# Patient Record
Sex: Female | Born: 1960 | Race: White | Hispanic: No | Marital: Married | Smoking: Never smoker
Health system: Southern US, Community
[De-identification: ages and names within clinical notes are randomized; demographics above are authoritative.]

## PROBLEM LIST (undated history)

## (undated) DIAGNOSIS — D693 Immune thrombocytopenic purpura: Secondary | ICD-10-CM

## (undated) HISTORY — DX: Immune thrombocytopenic purpura: D69.3

---

## 1998-03-11 DIAGNOSIS — IMO0002 Reserved for concepts with insufficient information to code with codable children: Secondary | ICD-10-CM | POA: Insufficient documentation

## 2005-06-19 DIAGNOSIS — E7800 Pure hypercholesterolemia, unspecified: Secondary | ICD-10-CM | POA: Insufficient documentation

## 2005-07-05 ENCOUNTER — Ambulatory Visit: Payer: Self-pay | Admitting: Internal Medicine

## 2005-07-09 ENCOUNTER — Ambulatory Visit: Payer: Self-pay | Admitting: Internal Medicine

## 2005-08-09 ENCOUNTER — Ambulatory Visit: Payer: Self-pay | Admitting: Internal Medicine

## 2005-09-12 ENCOUNTER — Ambulatory Visit: Payer: Self-pay | Admitting: Internal Medicine

## 2005-10-09 ENCOUNTER — Ambulatory Visit: Payer: Self-pay | Admitting: Internal Medicine

## 2006-04-11 ENCOUNTER — Ambulatory Visit: Payer: Self-pay

## 2006-09-09 ENCOUNTER — Ambulatory Visit: Payer: Self-pay | Admitting: Internal Medicine

## 2007-04-11 IMAGING — US ABDOMEN ULTRASOUND
1 series · 17 of 25 positions shown · non-contrast
Comparison: none

REASON FOR EXAM: Thrombocytopenia eval for hepatosplenomegaly
COMMENTS:

[Series 1: abdomen ultrasound · 17 of 63 slices shown]
[im 1/63]
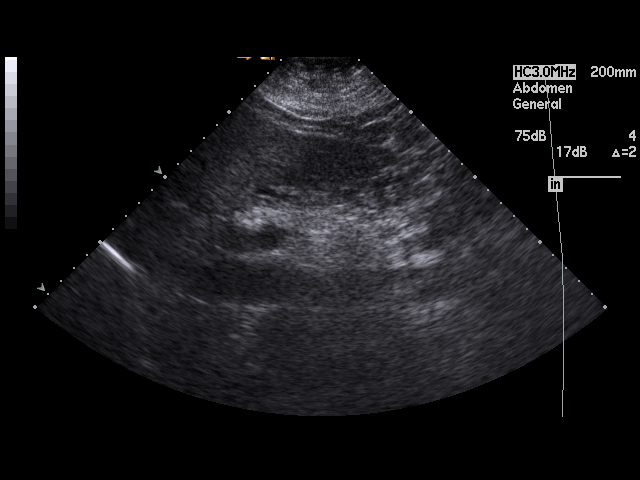
[im 6/63]
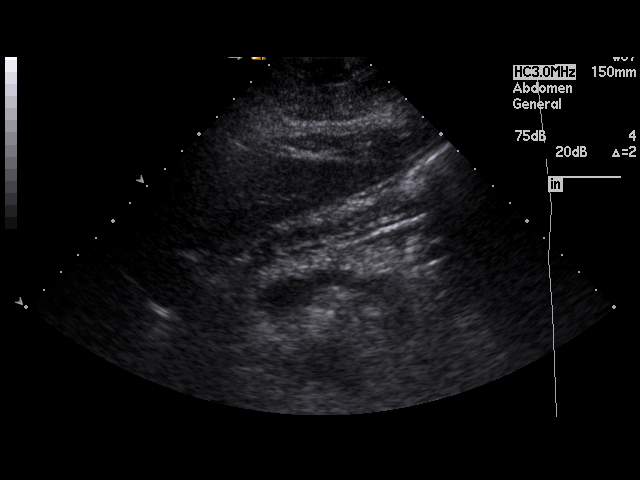
[im 8/63]
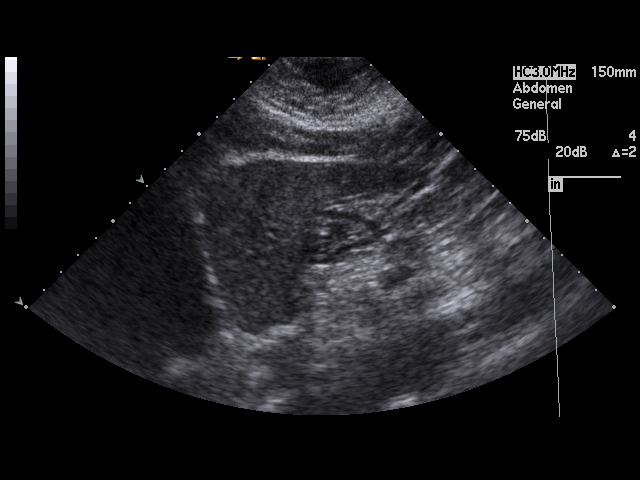
[im 13/63]
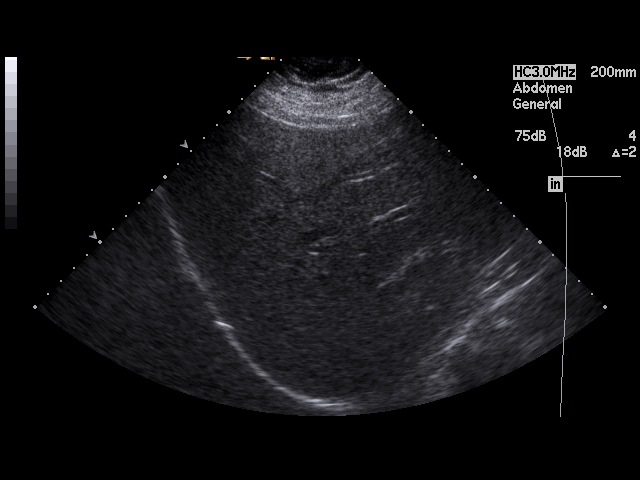
[im 16/63]
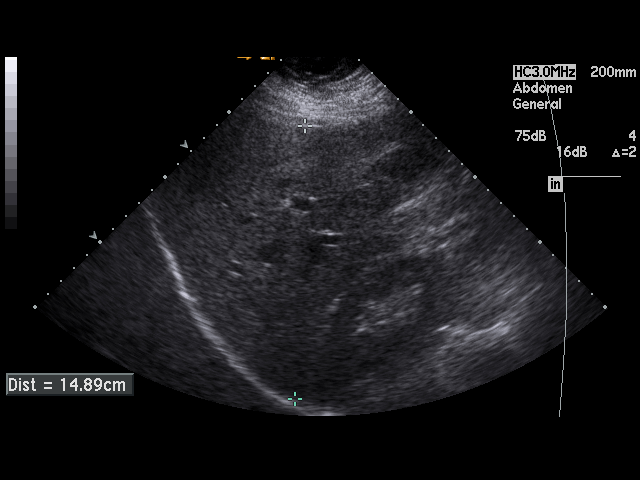
[im 21/63]
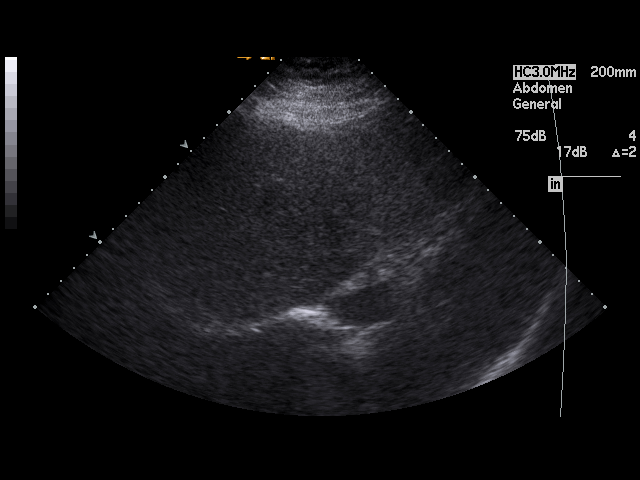
[im 24/63]
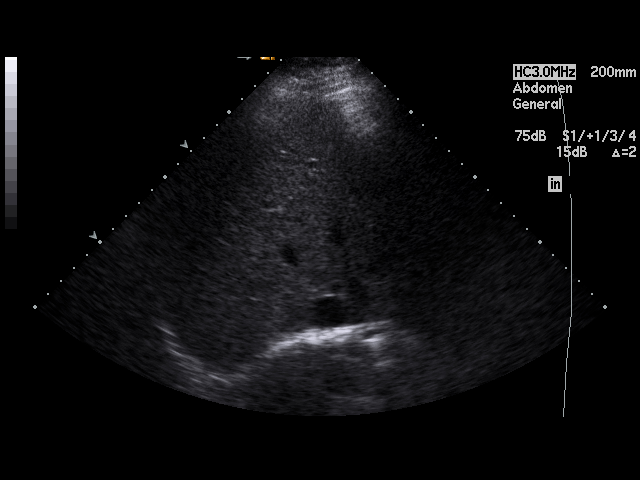
[im 29/63]
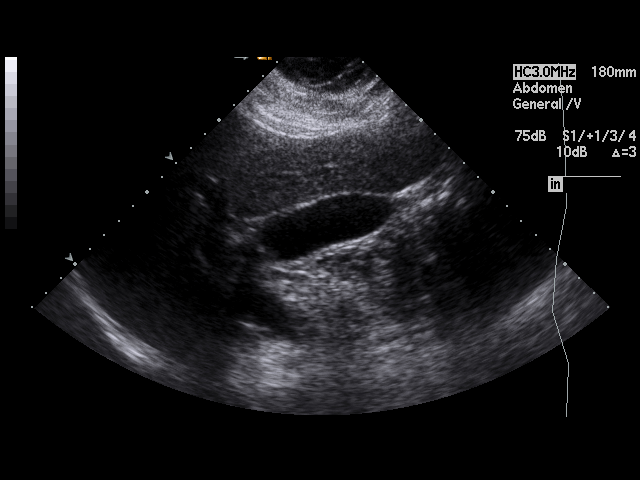
[im 32/63]
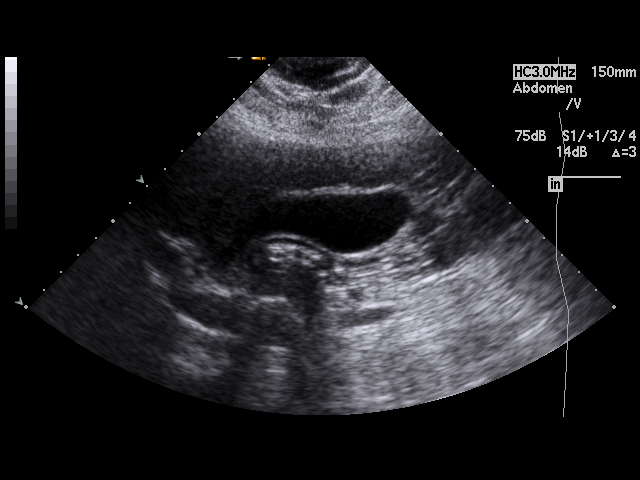
[im 34/63]
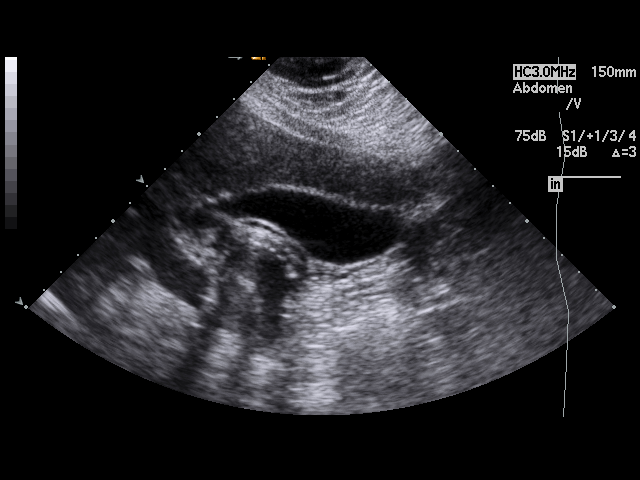
[im 39/63]
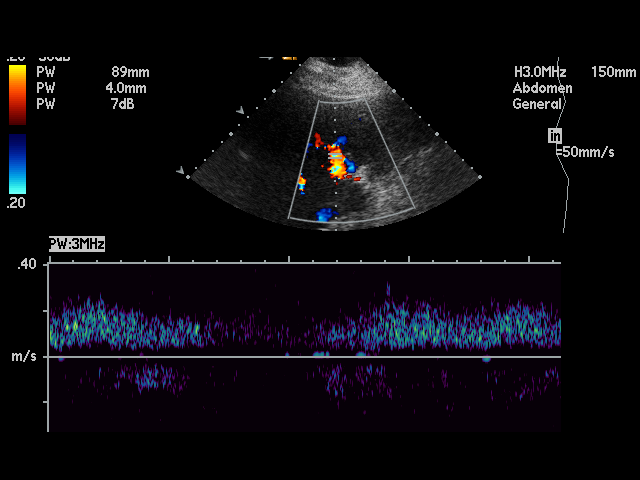
[im 42/63]
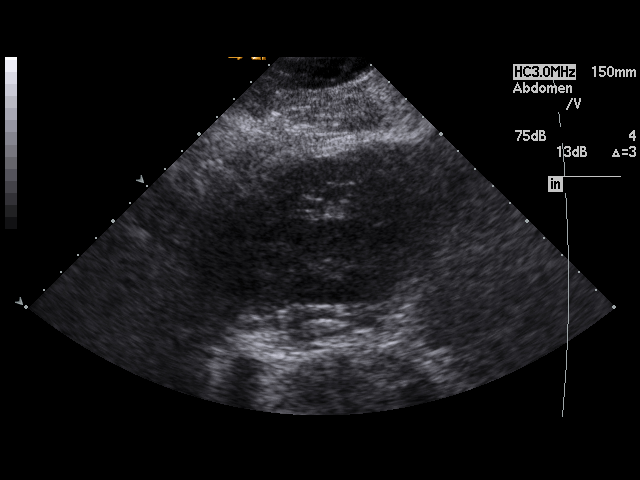
[im 47/63]
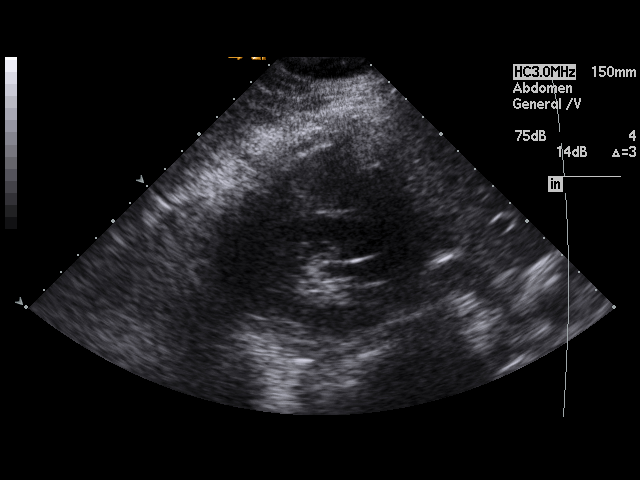
[im 50/63]
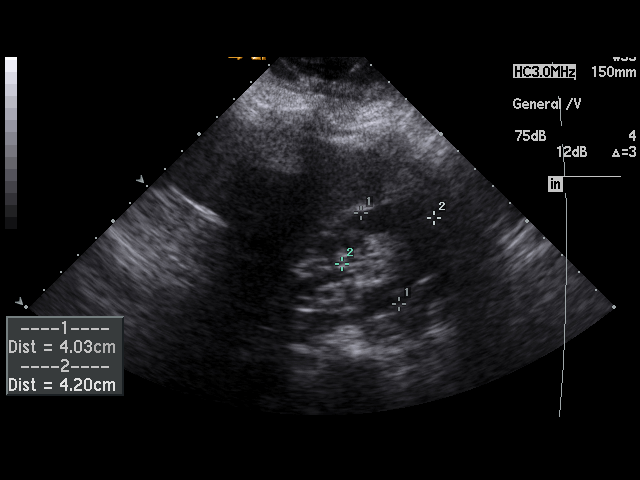
[im 55/63]
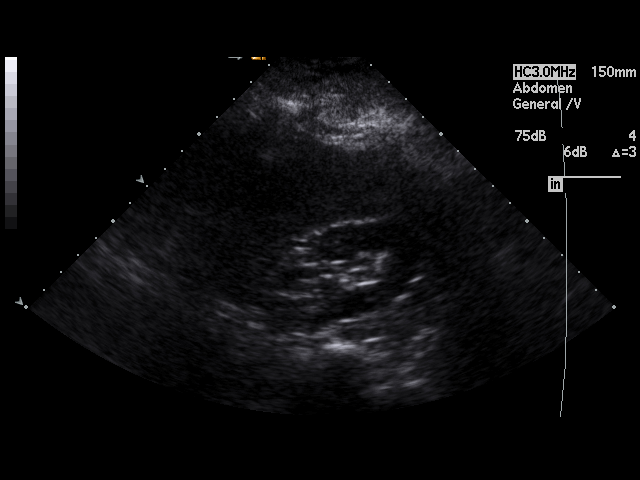
[im 57/63]
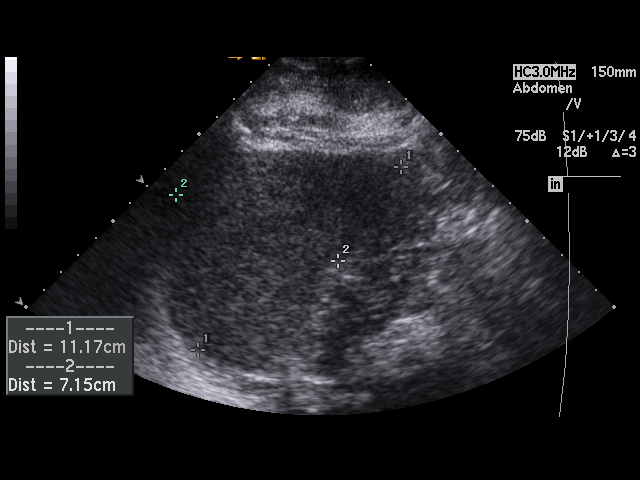
[im 63/63]
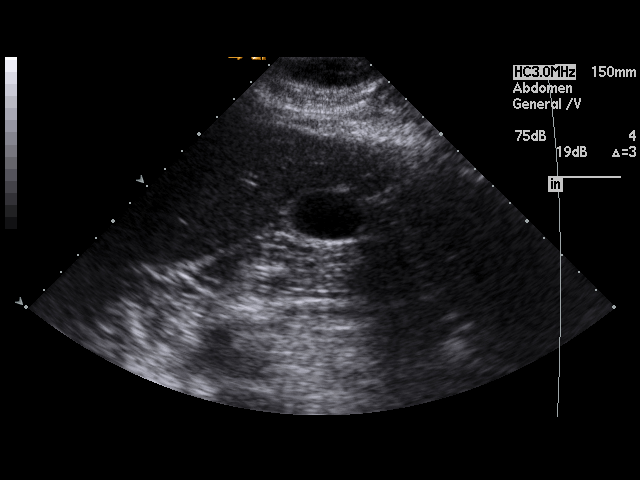

[17 of 25 positions shown; findings below may reference images not displayed]

PROCEDURE:     US  - US ABDOMEN GENERAL SURVEY  - July 08, 2005  [DATE]

RESULT:       The liver is normal in appearance.  The pancreas is visualized
and shows no significant abnormalities.  The spleen is enlarged. The spleen
measures approximately 16 cm x 7.15 cm.  No gallstones are seen.  There is
no thickening of the gallbladder wall.  The common bile duct measures 2.1 mm
in diameter which is within normal limits.  The kidneys show no
hydronephrosis. There is no ascites.
IMPRESSION: 1.     The spleen is enlarged.
2.     No gallstones are seen.
3.     The pancreas is normal in appearance.
4.     There is no ascites.

## 2007-04-25 IMAGING — CT CT ABD-PELV W/ CM
1 of 2 series · 15 of 32 positions shown, 19 images · non-contrast
Comparison: none

REASON FOR EXAM: Splenomegaly on US  thrombocytopenia  eval for lymphoma
COMMENTS:

[Series 2: abdomen · axial · 0.90mm/px · z∈[-223,+177]mm · 15 of 56 slices shown, 19 images]
[im 3/56  soft-tissue]
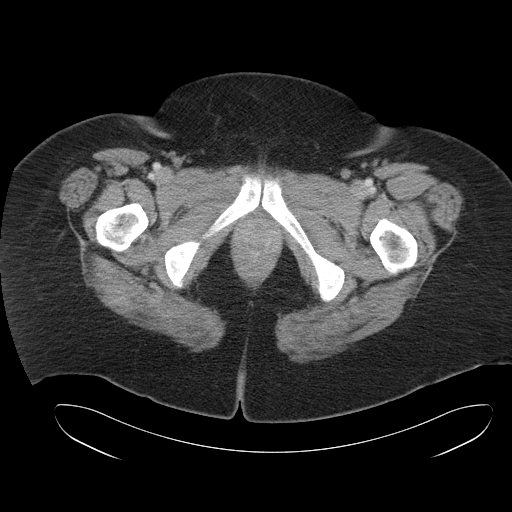
[im 3/56  bone]
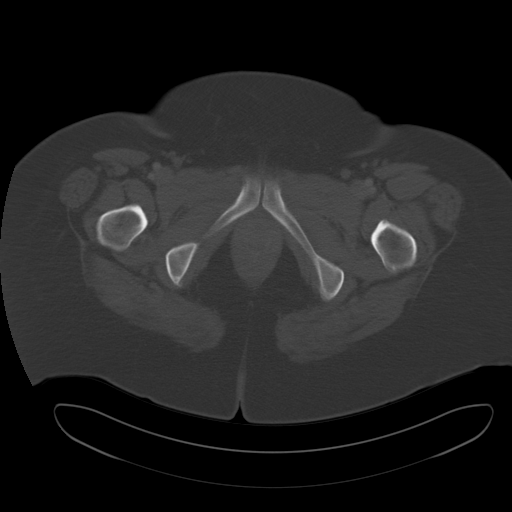
[im 8/56  soft-tissue]
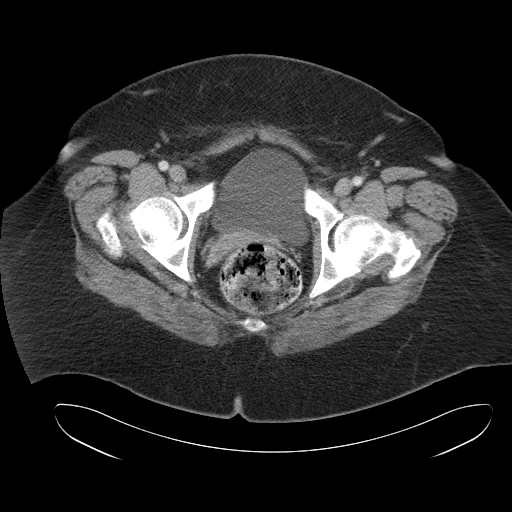
[im 12/56  soft-tissue]
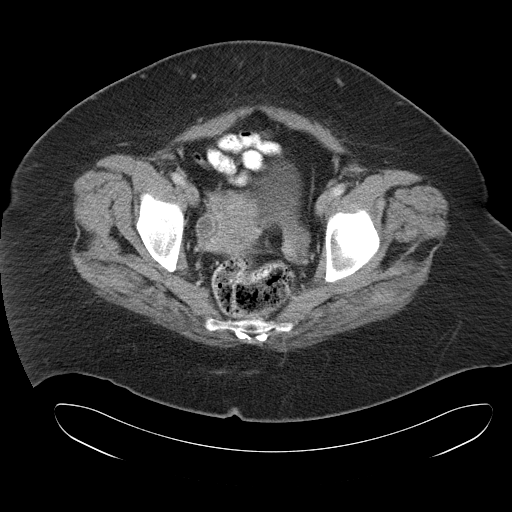
[im 15/56  soft-tissue]
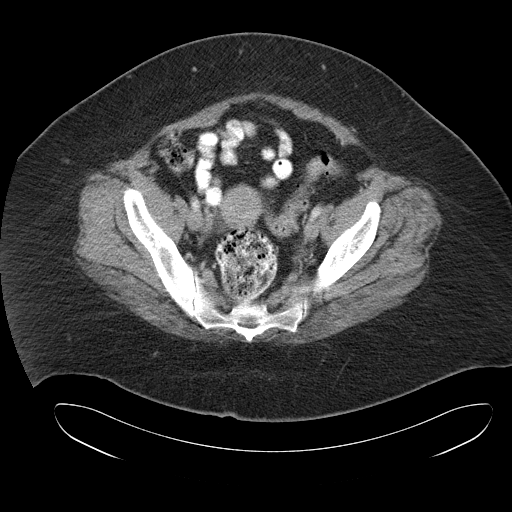
[im 20/56  soft-tissue]
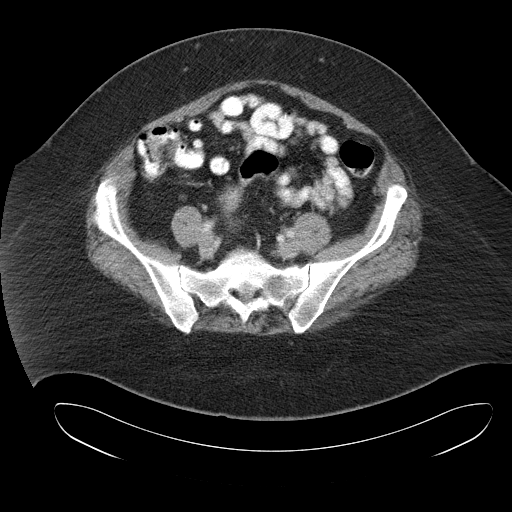
[im 24/56  soft-tissue]
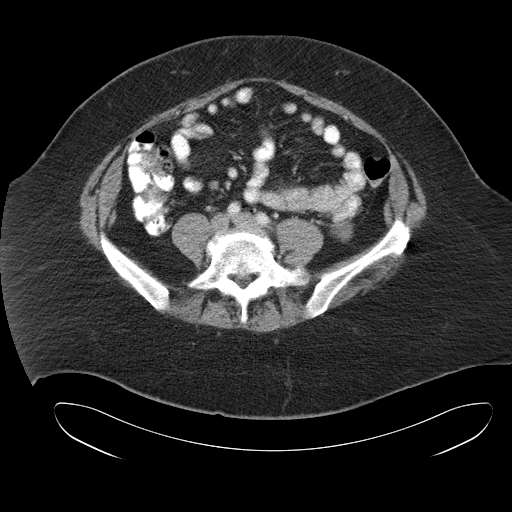
[im 29/56  soft-tissue]
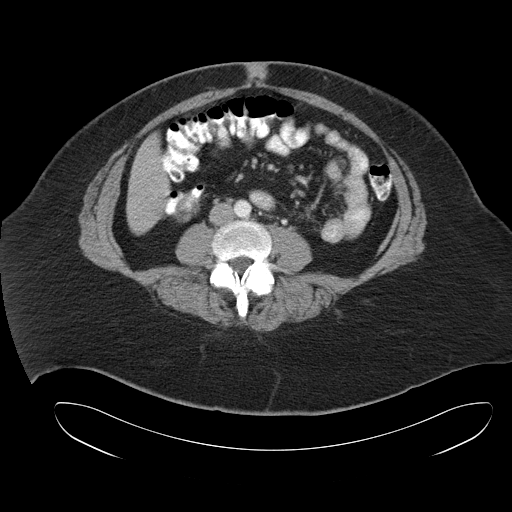
[im 32/56  soft-tissue]
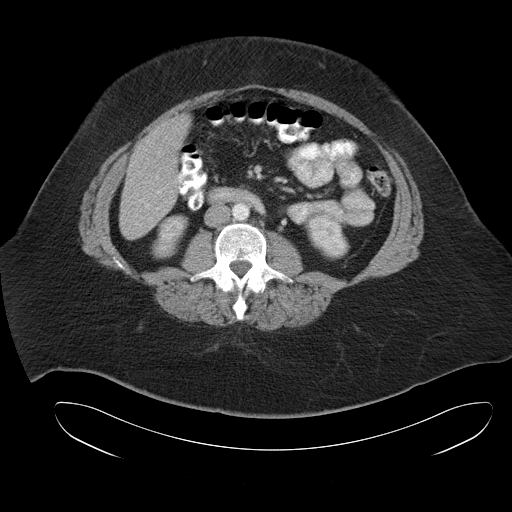
[im 36/56  soft-tissue]
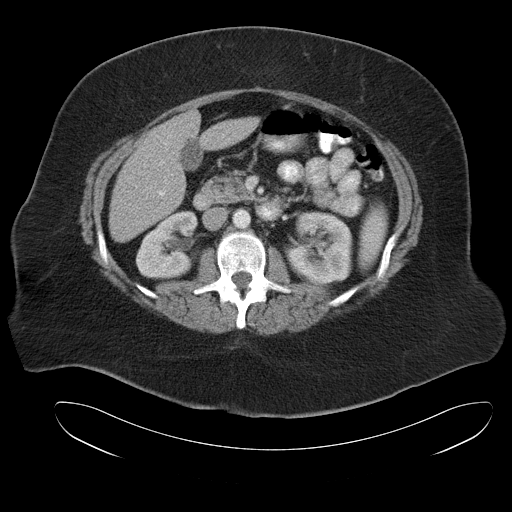
[im 36/56  bone]
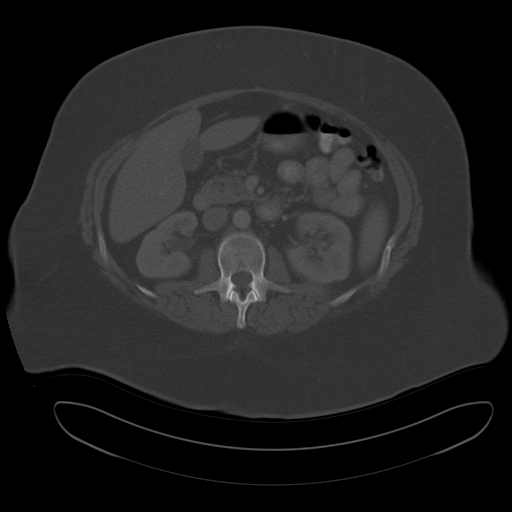
[im 41/56  soft-tissue]
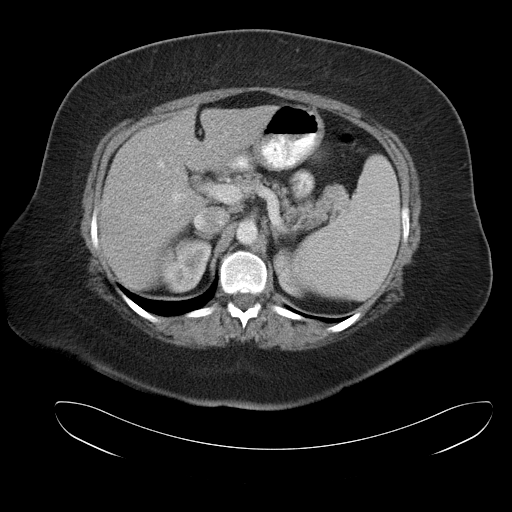
[im 44/56  soft-tissue]
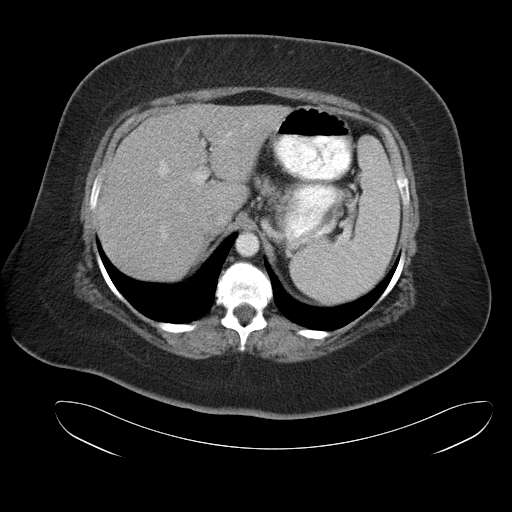
[im 46/56  lung]
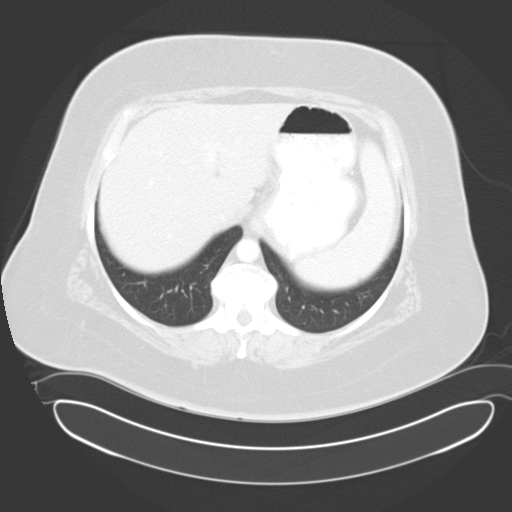
[im 48/56  soft-tissue]
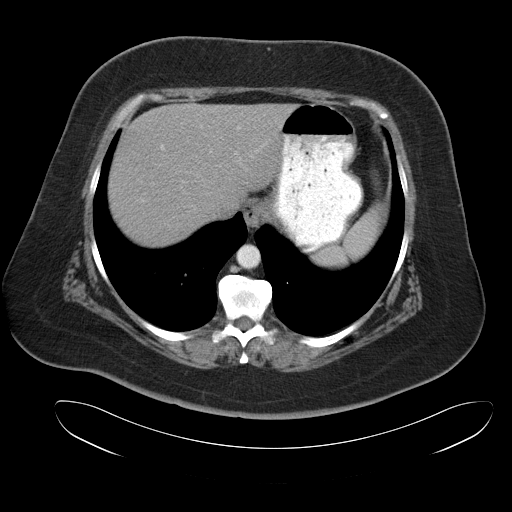
[im 48/56  lung]
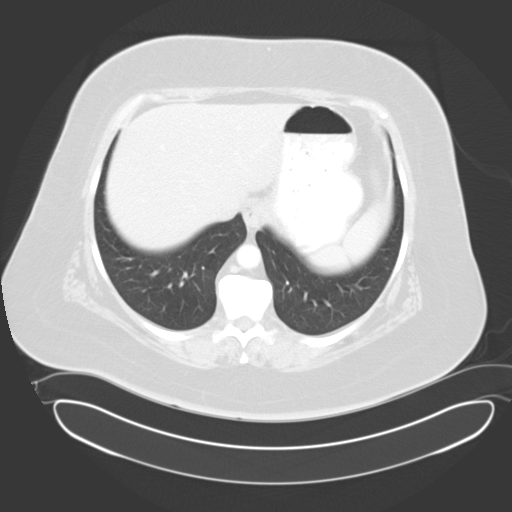
[im 51/56  lung]
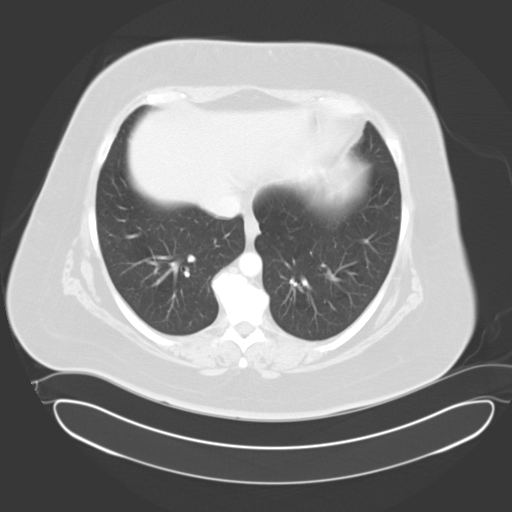
[im 53/56  soft-tissue]
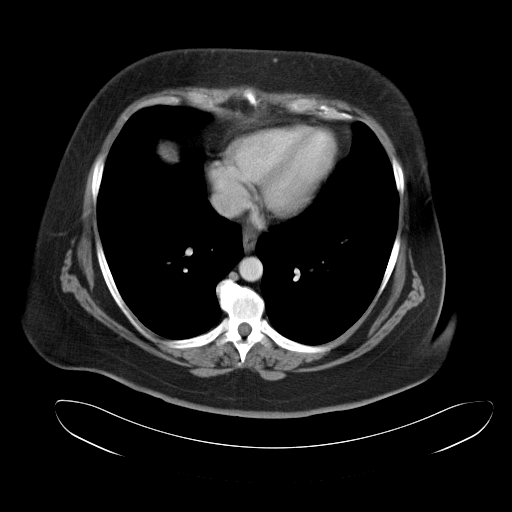
[im 53/56  lung]
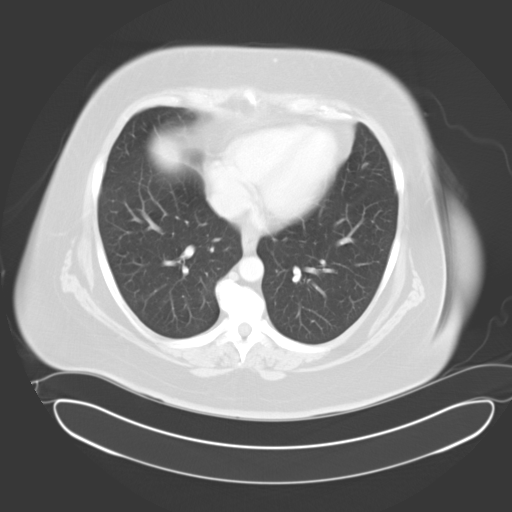

[15 of 32 positions shown; findings below may reference images not displayed]

PROCEDURE:     CT  - CT ABDOMEN / PELVIS  W  - July 22, 2005  [DATE]

RESULT:       The patient has known splenomegaly and has thrombocytopenia
and is being evaluated for possible intra-abdominal or pelvic lymphoma.
The patient received oral contrast as well as 100 ml of [id number].  The
spleen is top normal in size measuring 13 ml in length.  In AP dimension, it
measures approximately 15 ml. In transverse dimension, however, its maximal
dimension is approximately 9.0 cm.  The liver, stomach, pancreas,
gallbladder, adrenal glands, and kidneys are normal in appearance.  The
paraaortic and pericaval regions exhibit no lymphadenopathy.  I do not see
evidence of mesenteric lymphadenopathy.  No abnormal size lymph nodes in the
gastric bed or in the porta hepatis are seen either.  The partially
contrast-filled loops of small and large bowel exhibit no acute abnormality.

Within the pelvis, the urinary bladder is moderately distended and grossly
normal.  The uterus is situated slightly to the RIGHT of midline but appears
normal in density and contour.  There are likely physiologic cysts
associated with both ovaries.  There is no free fluid in the abdomen or
pelvis.  I see no pelvic nor inguinal lymphadenopathy.  The rectum is
moderately distended with stool.

The lung bases are clear.
IMPRESSION: 1.     I do not see evidence of lymphadenopathy.
2.     There is mild enlargement of the spleen which may be normal for the
patient in terms of relationship of the spleen size to body habitus.
3.     I do not see evidence of acute hepatobiliary disease nor acute renal
abnormality nor acute bowel abnormality.
4.     There are likely physiologic cysts associated with both ovaries.

## 2007-06-10 ENCOUNTER — Ambulatory Visit: Payer: Self-pay | Admitting: Internal Medicine

## 2007-07-10 ENCOUNTER — Ambulatory Visit: Payer: Self-pay | Admitting: Internal Medicine

## 2007-08-10 ENCOUNTER — Ambulatory Visit: Payer: Self-pay | Admitting: Internal Medicine

## 2007-09-09 ENCOUNTER — Ambulatory Visit: Payer: Self-pay | Admitting: Internal Medicine

## 2007-10-10 ENCOUNTER — Ambulatory Visit: Payer: Self-pay | Admitting: Internal Medicine

## 2007-11-17 ENCOUNTER — Ambulatory Visit: Payer: Self-pay | Admitting: Internal Medicine

## 2007-12-10 ENCOUNTER — Ambulatory Visit: Payer: Self-pay | Admitting: Internal Medicine

## 2008-01-10 ENCOUNTER — Ambulatory Visit: Payer: Self-pay | Admitting: Internal Medicine

## 2008-01-13 IMAGING — US US PELV - US TRANSVAGINAL
1 series · 17 of 25 positions shown · non-contrast
Comparison: none

REASON FOR EXAM: Irregular Menses
COMMENTS:

PROCEDURE:     US  - US PELVIS MASS EXAM  - [DATE]  [DATE] [DATE]  [DATE]
RESULT:
HISTORY: Irregular menses.
The size, shape and echotexture of the uterus is normal.  The uterine length
is 6.7 cm.   The RIGHT ovary measures 2.9 cm in maximum diameter and the
LEFT 2.6. Simple cysts are noted in both ovaries.  There is no
hydronephrosis.

[Series 1: us pelv - us transvaginal · 17 of 61 slices shown]
[im 1/61]
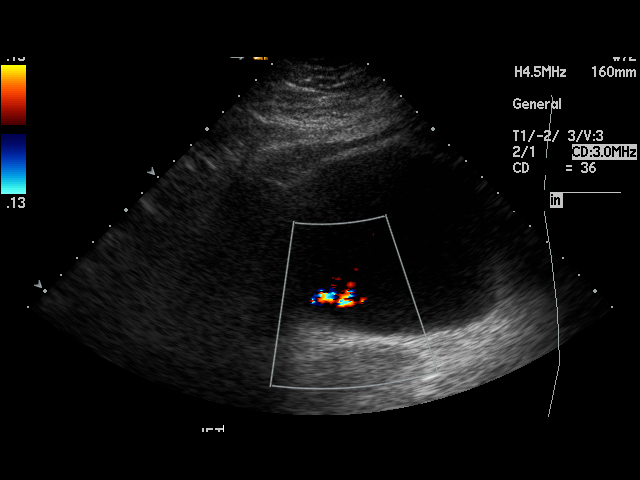
[im 6/61]
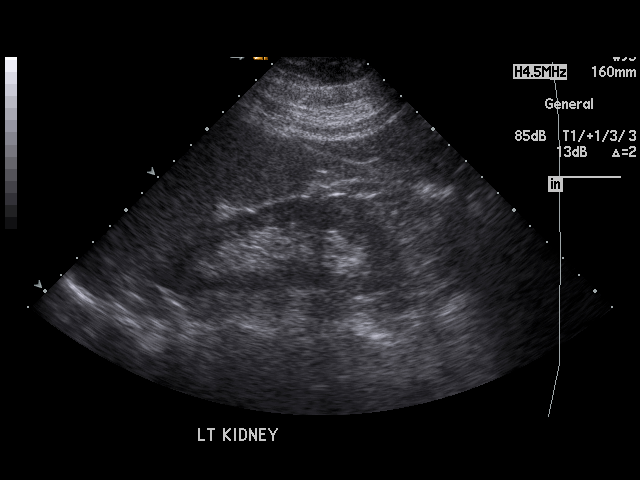
[im 8/61]
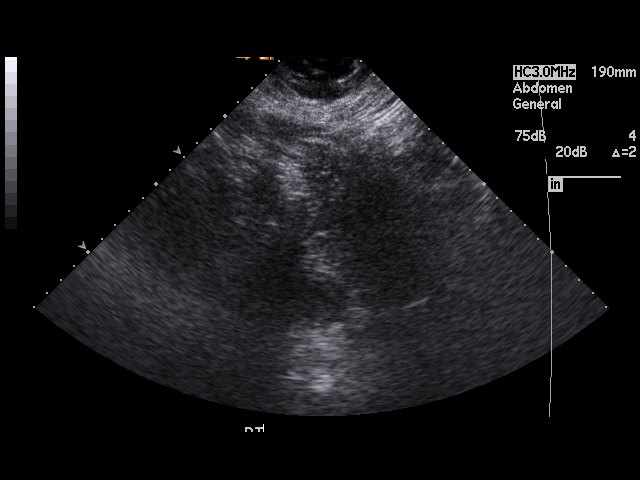
[im 13/61]
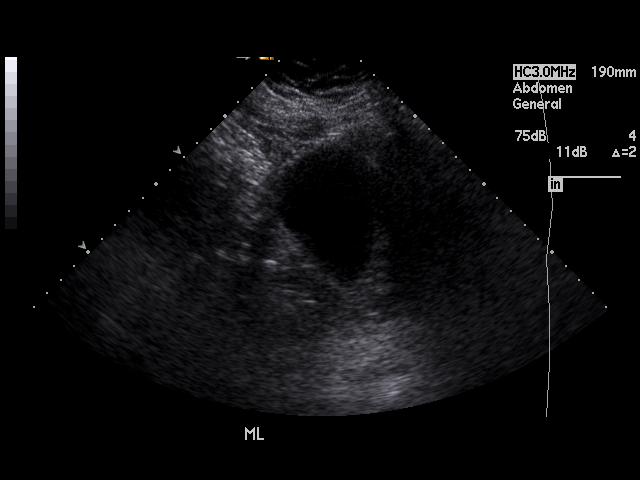
[im 16/61]
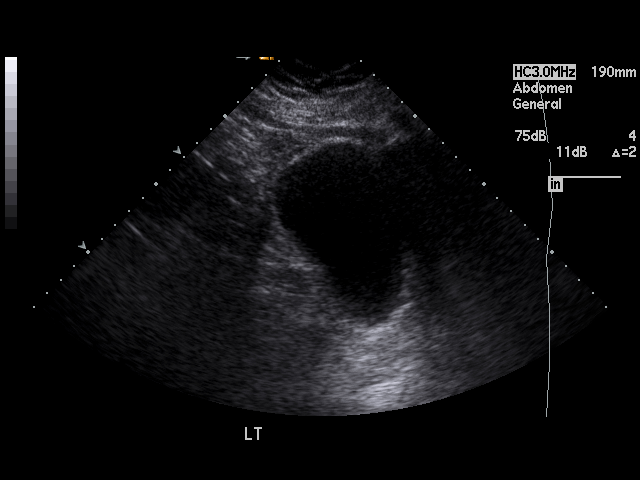
[im 21/61]
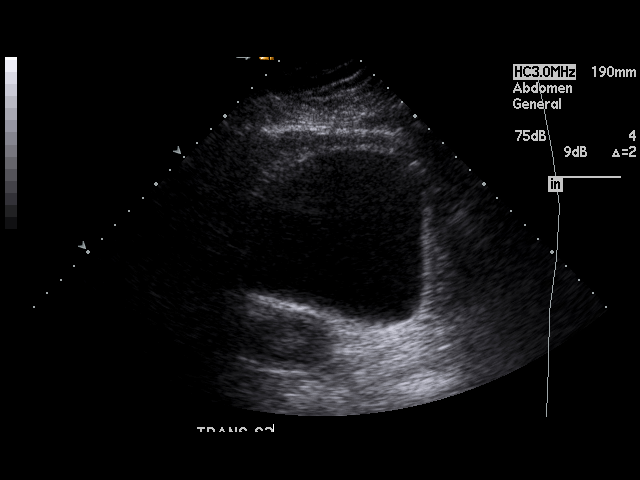
[im 23/61]
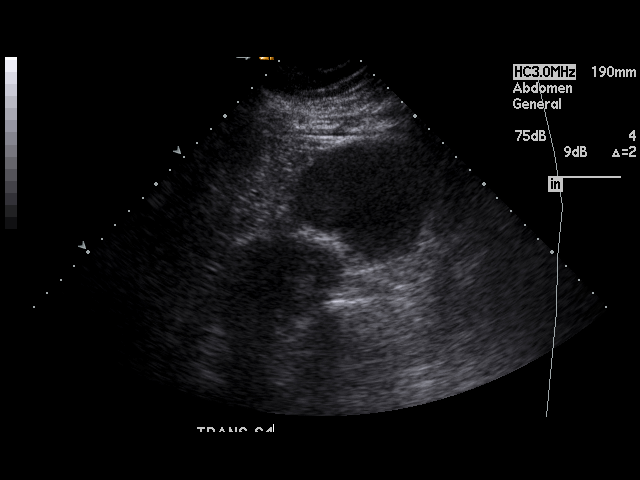
[im 28/61]
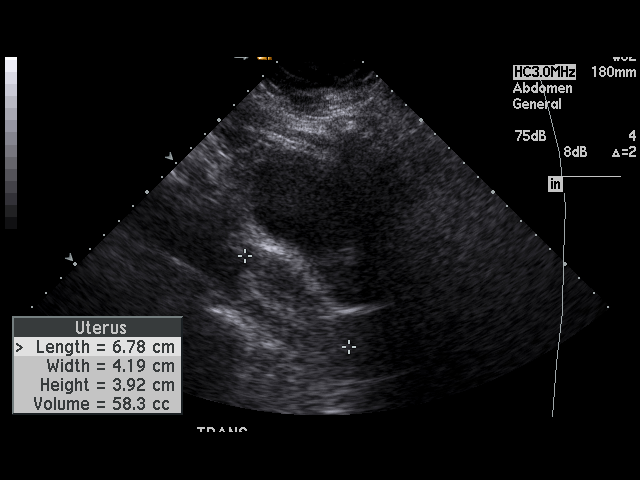
[im 31/61]
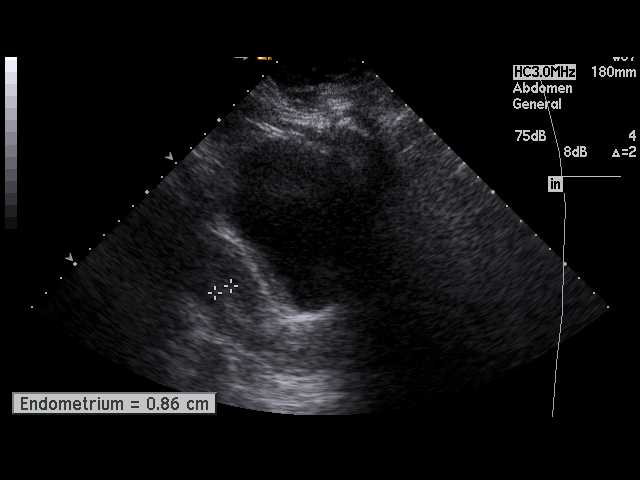
[im 33/61]
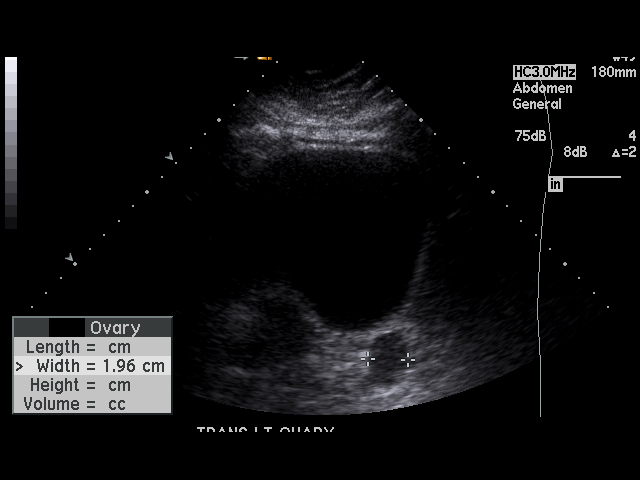
[im 38/61]
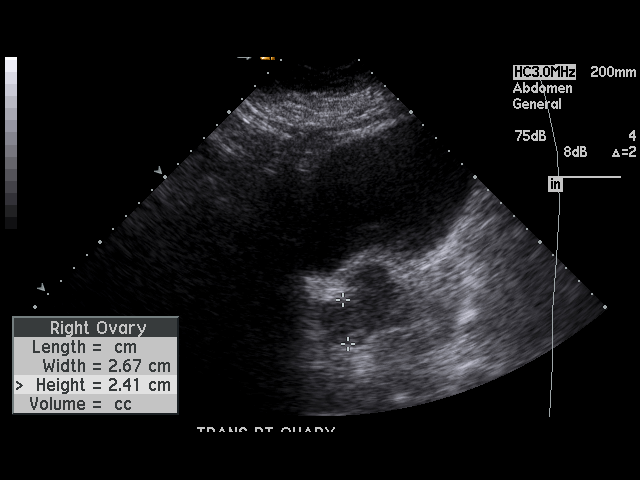
[im 41/61]
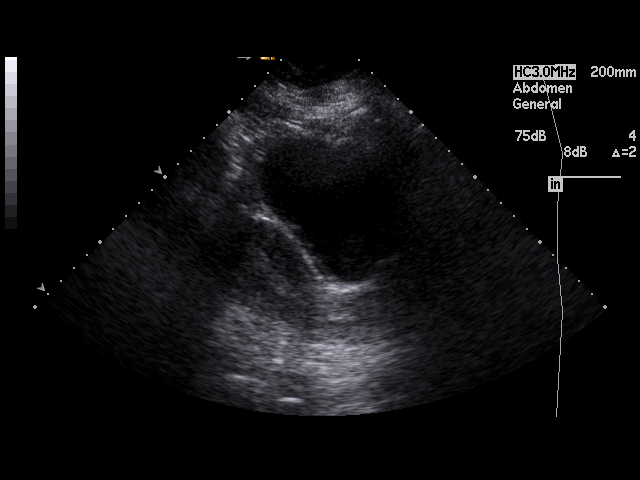
[im 46/61]
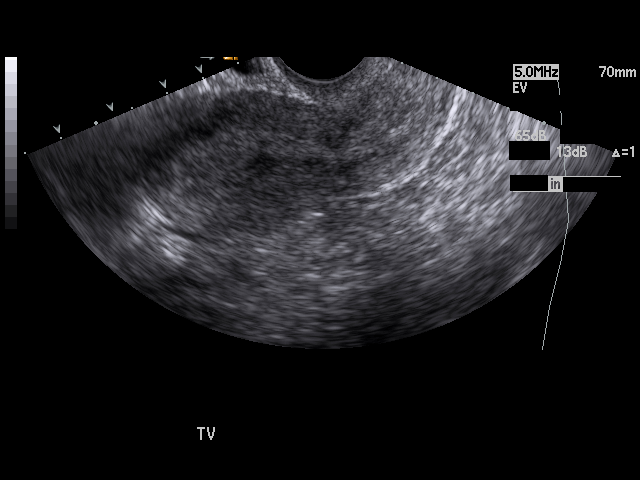
[im 48/61]
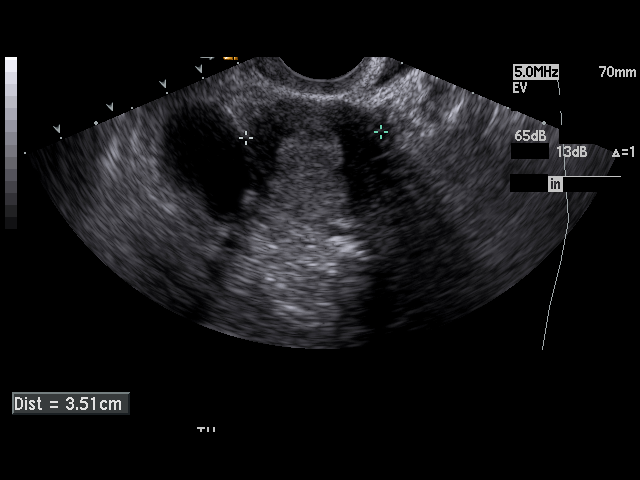
[im 53/61]
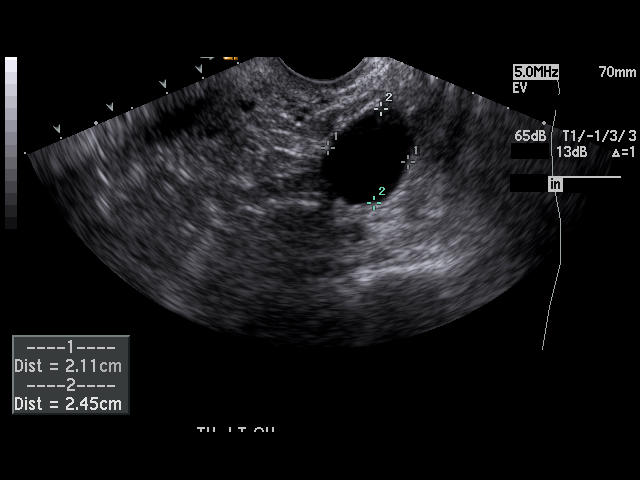
[im 56/61]
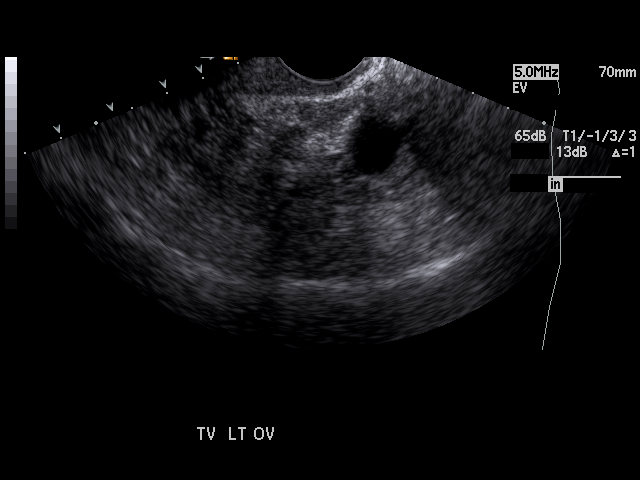
[im 61/61]
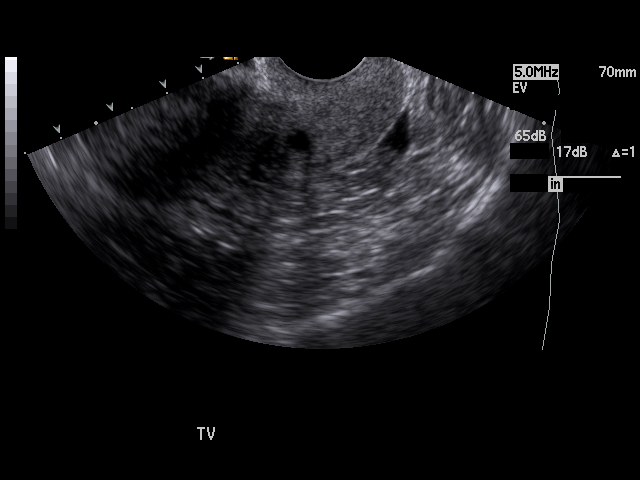

[17 of 25 positions shown; findings below may reference images not displayed]

IMPRESSION: Bilateral ovarian cysts.

## 2009-06-09 DIAGNOSIS — J309 Allergic rhinitis, unspecified: Secondary | ICD-10-CM | POA: Insufficient documentation

## 2011-02-13 ENCOUNTER — Ambulatory Visit: Payer: Self-pay | Admitting: Medical

## 2011-03-12 ENCOUNTER — Ambulatory Visit: Payer: Self-pay | Admitting: Medical

## 2011-04-12 ENCOUNTER — Ambulatory Visit: Payer: Self-pay | Admitting: Medical

## 2011-05-10 ENCOUNTER — Ambulatory Visit: Payer: Self-pay | Admitting: Medical

## 2011-07-04 ENCOUNTER — Ambulatory Visit: Payer: Self-pay | Admitting: Medical

## 2011-07-10 ENCOUNTER — Ambulatory Visit: Payer: Self-pay | Admitting: Medical

## 2011-08-15 ENCOUNTER — Ambulatory Visit: Payer: Self-pay | Admitting: Medical

## 2012-09-24 LAB — HM PAP SMEAR: HM Pap smear: NEGATIVE

## 2013-02-24 ENCOUNTER — Ambulatory Visit: Payer: Self-pay | Admitting: Obstetrics and Gynecology

## 2013-02-24 LAB — CBC WITH DIFFERENTIAL/PLATELET
Eosinophil #: 0.2 10*3/uL (ref 0.0–0.7)
Eosinophil %: 2.2 %
HCT: 40.2 % (ref 35.0–47.0)
HGB: 13.6 g/dL (ref 12.0–16.0)
Lymphocyte #: 3 10*3/uL (ref 1.0–3.6)
MCH: 30.1 pg (ref 26.0–34.0)
MCV: 89 fL (ref 80–100)
Monocyte #: 0.4 x10 3/mm (ref 0.2–0.9)
Monocyte %: 5.2 %
Neutrophil #: 4.6 10*3/uL (ref 1.4–6.5)
Neutrophil %: 55.5 %
RDW: 13.8 % (ref 11.5–14.5)
WBC: 8.3 10*3/uL (ref 3.6–11.0)

## 2013-02-24 LAB — BASIC METABOLIC PANEL
Calcium, Total: 9.4 mg/dL (ref 8.5–10.1)
EGFR (Non-African Amer.): >60
Osmolality: 274 (ref 275–301)
Potassium: 4 mmol/L (ref 3.5–5.1)
Sodium: 137 mmol/L (ref 136–145)

## 2013-02-24 LAB — PREGNANCY, URINE: Pregnancy Test, Urine: NEGATIVE m[IU]/mL

## 2013-03-17 ENCOUNTER — Other Ambulatory Visit: Payer: Self-pay | Admitting: Obstetrics and Gynecology

## 2013-03-17 LAB — CBC WITH DIFFERENTIAL/PLATELET
BASOS ABS: 0.1 10*3/uL (ref 0.0–0.1)
Basophil %: 0.9 %
Eosinophil #: 0.2 10*3/uL (ref 0.0–0.7)
Eosinophil %: 2.1 %
HCT: 40.7 % (ref 35.0–47.0)
HGB: 13.7 g/dL (ref 12.0–16.0)
Lymphocyte #: 3.6 10*3/uL (ref 1.0–3.6)
Lymphocyte %: 39.8 %
MCH: 30.2 pg (ref 26.0–34.0)
MCHC: 33.7 g/dL (ref 32.0–36.0)
MCV: 90 fL (ref 80–100)
Monocyte #: 0.7 x10 3/mm (ref 0.2–0.9)
Monocyte %: 7.5 %
NEUTROS ABS: 4.5 10*3/uL (ref 1.4–6.5)
NEUTROS PCT: 49.7 %
Platelet: 53 10*3/uL — ABNORMAL LOW (ref 150–440)
RBC: 4.54 10*6/uL (ref 3.80–5.20)
RDW: 14 % (ref 11.5–14.5)
WBC: 9 10*3/uL (ref 3.6–11.0)

## 2013-03-17 LAB — PROTIME-INR
INR: 1.1
Prothrombin Time: 13.7 secs (ref 11.5–14.7)

## 2013-03-17 LAB — APTT: ACTIVATED PTT: 28.8 s (ref 23.6–35.9)

## 2013-03-19 ENCOUNTER — Ambulatory Visit: Payer: Self-pay | Admitting: Internal Medicine

## 2013-03-19 LAB — PROTIME-INR
INR: 1.1
Prothrombin Time: 13.9 secs (ref 11.5–14.7)

## 2013-03-19 LAB — CBC CANCER CENTER
Basophil #: 0.1 x10 3/mm (ref 0.0–0.1)
Basophil %: 1.1 %
Basophil: 1 %
COMMENT - H1-COM2: NORMAL
Comment - H1-Com1: NORMAL
EOS ABS: 0.1 x10 3/mm (ref 0.0–0.7)
Eosinophil %: 1.3 %
Eosinophil: 1 %
HCT: 40.8 % (ref 35.0–47.0)
HGB: 13.9 g/dL (ref 12.0–16.0)
LYMPHS ABS: 3.6 x10 3/mm (ref 1.0–3.6)
LYMPHS PCT: 46 %
Lymphocyte %: 39.8 %
MCH: 30.2 pg (ref 26.0–34.0)
MCHC: 34.1 g/dL (ref 32.0–36.0)
MCV: 89 fL (ref 80–100)
MONO ABS: 0.5 x10 3/mm (ref 0.2–0.9)
Monocyte %: 5.3 %
Monocytes: 6 %
Neutrophil #: 4.7 x10 3/mm (ref 1.4–6.5)
Neutrophil %: 52.5 %
Platelet: 50 x10 3/mm — ABNORMAL LOW (ref 150–440)
RBC: 4.6 10*6/uL (ref 3.80–5.20)
RDW: 14 % (ref 11.5–14.5)
SEGMENTED NEUTROPHILS: 46 %
WBC: 8.9 x10 3/mm (ref 3.6–11.0)

## 2013-03-19 LAB — FERRITIN: FERRITIN (ARMC): 53 ng/mL (ref 8–388)

## 2013-03-19 LAB — RETICULOCYTES
Absolute Retic Count: 0.114 10*6/uL (ref 0.019–0.186)
RETICULOCYTE: 2.5 % (ref 0.4–3.1)

## 2013-03-19 LAB — FOLATE: Folic Acid: 18.1 ng/mL (ref 3.1–100.0)

## 2013-03-19 LAB — IRON AND TIBC
IRON BIND. CAP.(TOTAL): 402 ug/dL (ref 250–450)
IRON SATURATION: 22 %
Iron: 89 ug/dL (ref 50–170)
Unbound Iron-Bind.Cap.: 313 ug/dL

## 2013-03-19 LAB — LACTATE DEHYDROGENASE: LDH: 166 U/L (ref 81–246)

## 2013-03-19 LAB — APTT: ACTIVATED PTT: 29.2 s (ref 23.6–35.9)

## 2013-03-19 LAB — FIBRINOGEN: FIBRINOGEN: 373 mg/dL (ref 210–470)

## 2013-03-19 LAB — FIBRIN DEGRADATION PROD.(ARMC ONLY): Fibrin Degradation Prod.: <10 ug/ml (ref 2.1–7.7)

## 2013-03-22 LAB — PROT IMMUNOELECTROPHORES(ARMC)

## 2013-03-24 LAB — CBC CANCER CENTER
BASOS ABS: 0 x10 3/mm (ref 0.0–0.1)
BASOS PCT: 0.3 %
EOS PCT: 0.1 %
Eosinophil #: 0 x10 3/mm (ref 0.0–0.7)
HCT: 41.1 % (ref 35.0–47.0)
HGB: 13.5 g/dL (ref 12.0–16.0)
Lymphocyte #: 2 x10 3/mm (ref 1.0–3.6)
Lymphocyte %: 18.5 %
MCH: 29.9 pg (ref 26.0–34.0)
MCHC: 32.8 g/dL (ref 32.0–36.0)
MCV: 91 fL (ref 80–100)
Monocyte #: 0.3 x10 3/mm (ref 0.2–0.9)
Monocyte %: 2.4 %
NEUTROS PCT: 78.7 %
Neutrophil #: 8.6 x10 3/mm — ABNORMAL HIGH (ref 1.4–6.5)
Platelet: 103 x10 3/mm — ABNORMAL LOW (ref 150–440)
RBC: 4.51 10*6/uL (ref 3.80–5.20)
RDW: 14.1 % (ref 11.5–14.5)
WBC: 10.9 x10 3/mm (ref 3.6–11.0)

## 2013-03-24 LAB — GLUCOSE, RANDOM: Glucose: 157 mg/dL — ABNORMAL HIGH (ref 65–99)

## 2013-03-29 LAB — CBC CANCER CENTER
BASOS ABS: 0.1 x10 3/mm (ref 0.0–0.1)
Basophil %: 0.4 %
Eosinophil #: 0 x10 3/mm (ref 0.0–0.7)
Eosinophil %: 0.2 %
HCT: 41.2 % (ref 35.0–47.0)
HGB: 13.6 g/dL (ref 12.0–16.0)
LYMPHS ABS: 2.2 x10 3/mm (ref 1.0–3.6)
Lymphocyte %: 16.4 %
MCH: 30.5 pg (ref 26.0–34.0)
MCHC: 33 g/dL (ref 32.0–36.0)
MCV: 93 fL (ref 80–100)
MONO ABS: 0.5 x10 3/mm (ref 0.2–0.9)
Monocyte %: 3.4 %
NEUTROS ABS: 10.8 x10 3/mm — AB (ref 1.4–6.5)
Neutrophil %: 79.6 %
Platelet: 136 x10 3/mm — ABNORMAL LOW (ref 150–440)
RBC: 4.46 10*6/uL (ref 3.80–5.20)
RDW: 14.1 % (ref 11.5–14.5)
WBC: 13.6 x10 3/mm — AB (ref 3.6–11.0)

## 2013-03-31 ENCOUNTER — Ambulatory Visit: Payer: Self-pay | Admitting: Obstetrics and Gynecology

## 2013-03-31 LAB — CBC WITH DIFFERENTIAL/PLATELET
Basophil #: 0.1 10*3/uL (ref 0.0–0.1)
Basophil %: 0.3 %
Eosinophil #: 0.1 10*3/uL (ref 0.0–0.7)
Eosinophil %: 0.5 %
HCT: 39.8 % (ref 35.0–47.0)
HGB: 13.1 g/dL (ref 12.0–16.0)
LYMPHS PCT: 39.8 %
Lymphocyte #: 6.3 10*3/uL — ABNORMAL HIGH (ref 1.0–3.6)
MCH: 30.4 pg (ref 26.0–34.0)
MCHC: 33 g/dL (ref 32.0–36.0)
MCV: 92 fL (ref 80–100)
Monocyte #: 0.9 x10 3/mm (ref 0.2–0.9)
Monocyte %: 5.9 %
Neutrophil #: 8.5 10*3/uL — ABNORMAL HIGH (ref 1.4–6.5)
Neutrophil %: 53.5 %
Platelet: 115 10*3/uL — ABNORMAL LOW (ref 150–440)
RBC: 4.33 10*6/uL (ref 3.80–5.20)
RDW: 14.2 % (ref 11.5–14.5)
WBC: 15.9 10*3/uL — ABNORMAL HIGH (ref 3.6–11.0)

## 2013-03-31 LAB — BASIC METABOLIC PANEL
ANION GAP: 5 — AB (ref 7–16)
BUN: 19 mg/dL — ABNORMAL HIGH (ref 7–18)
CO2: 28 mmol/L (ref 21–32)
CREATININE: 0.85 mg/dL (ref 0.60–1.30)
Calcium, Total: 8.6 mg/dL (ref 8.5–10.1)
Chloride: 103 mmol/L (ref 98–107)
EGFR (Non-African Amer.): >60
Glucose: 77 mg/dL (ref 65–99)
OSMOLALITY: 273 (ref 275–301)
POTASSIUM: 3.4 mmol/L — AB (ref 3.5–5.1)
Sodium: 136 mmol/L (ref 136–145)

## 2013-03-31 LAB — PREGNANCY, URINE: PREGNANCY TEST, URINE: NEGATIVE m[IU]/mL

## 2013-04-01 ENCOUNTER — Ambulatory Visit: Payer: Self-pay | Admitting: Obstetrics and Gynecology

## 2013-04-01 HISTORY — PX: ABDOMINAL HYSTERECTOMY: SHX81

## 2013-04-02 LAB — CBC WITH DIFFERENTIAL/PLATELET
Basophil #: 0 10*3/uL (ref 0.0–0.1)
Basophil %: 0.2 %
EOS ABS: 0 10*3/uL (ref 0.0–0.7)
Eosinophil %: 0.2 %
HCT: 37.9 % (ref 35.0–47.0)
HGB: 12.2 g/dL (ref 12.0–16.0)
LYMPHS ABS: 4.1 10*3/uL — AB (ref 1.0–3.6)
Lymphocyte %: 23.6 %
MCH: 29.8 pg (ref 26.0–34.0)
MCHC: 32.1 g/dL (ref 32.0–36.0)
MCV: 93 fL (ref 80–100)
MONO ABS: 1.2 x10 3/mm — AB (ref 0.2–0.9)
Monocyte %: 7 %
Neutrophil #: 11.8 10*3/uL — ABNORMAL HIGH (ref 1.4–6.5)
Neutrophil %: 69 %
PLATELETS: 103 10*3/uL — AB (ref 150–440)
RBC: 4.08 10*6/uL (ref 3.80–5.20)
RDW: 14.2 % (ref 11.5–14.5)
WBC: 17.2 10*3/uL — ABNORMAL HIGH (ref 3.6–11.0)

## 2013-04-06 LAB — PATHOLOGY REPORT

## 2013-04-11 ENCOUNTER — Ambulatory Visit: Payer: Self-pay | Admitting: Internal Medicine

## 2013-04-14 LAB — CBC CANCER CENTER
BASOS ABS: 0.1 x10 3/mm (ref 0.0–0.1)
Basophil %: 0.9 %
EOS ABS: 0.1 x10 3/mm (ref 0.0–0.7)
Eosinophil %: 0.4 %
HCT: 43.5 % (ref 35.0–47.0)
HGB: 14.1 g/dL (ref 12.0–16.0)
Lymphocyte #: 1.7 x10 3/mm (ref 1.0–3.6)
Lymphocyte %: 12.1 %
MCH: 29.7 pg (ref 26.0–34.0)
MCHC: 32.5 g/dL (ref 32.0–36.0)
MCV: 92 fL (ref 80–100)
Monocyte #: 0.5 x10 3/mm (ref 0.2–0.9)
Monocyte %: 3.6 %
NEUTROS PCT: 83 %
Neutrophil #: 11.3 x10 3/mm — ABNORMAL HIGH (ref 1.4–6.5)
Platelet: 61 x10 3/mm — ABNORMAL LOW (ref 150–440)
RBC: 4.76 10*6/uL (ref 3.80–5.20)
RDW: 14 % (ref 11.5–14.5)
WBC: 13.7 x10 3/mm — AB (ref 3.6–11.0)

## 2013-04-14 LAB — GLUCOSE, RANDOM: GLUCOSE: 157 mg/dL — AB (ref 65–99)

## 2013-05-05 LAB — CBC CANCER CENTER
BASOS PCT: 1 %
Basophil #: 0.1 x10 3/mm (ref 0.0–0.1)
Eosinophil #: 0.4 x10 3/mm (ref 0.0–0.7)
Eosinophil %: 4 %
HCT: 40.9 % (ref 35.0–47.0)
HGB: 13.3 g/dL (ref 12.0–16.0)
LYMPHS ABS: 3.1 x10 3/mm (ref 1.0–3.6)
LYMPHS PCT: 33.7 %
MCH: 29.7 pg (ref 26.0–34.0)
MCHC: 32.5 g/dL (ref 32.0–36.0)
MCV: 91 fL (ref 80–100)
Monocyte #: 0.6 x10 3/mm (ref 0.2–0.9)
Monocyte %: 6.7 %
NEUTROS ABS: 5 x10 3/mm (ref 1.4–6.5)
Neutrophil %: 54.6 %
PLATELETS: 69 x10 3/mm — AB (ref 150–440)
RBC: 4.48 10*6/uL (ref 3.80–5.20)
RDW: 14.2 % (ref 11.5–14.5)
WBC: 9.1 x10 3/mm (ref 3.6–11.0)

## 2013-05-09 ENCOUNTER — Ambulatory Visit: Payer: Self-pay | Admitting: Internal Medicine

## 2013-07-07 ENCOUNTER — Ambulatory Visit: Payer: Self-pay | Admitting: Internal Medicine

## 2013-07-07 LAB — CBC CANCER CENTER
Basophil #: 0.1 x10 3/mm (ref 0.0–0.1)
Basophil %: 1.1 %
EOS ABS: 0.2 x10 3/mm (ref 0.0–0.7)
EOS PCT: 2.4 %
HCT: 39.9 % (ref 35.0–47.0)
HGB: 13.4 g/dL (ref 12.0–16.0)
LYMPHS ABS: 3.4 x10 3/mm (ref 1.0–3.6)
Lymphocyte %: 36.8 %
MCH: 29.9 pg (ref 26.0–34.0)
MCHC: 33.7 g/dL (ref 32.0–36.0)
MCV: 89 fL (ref 80–100)
MONOS PCT: 6 %
Monocyte #: 0.6 x10 3/mm (ref 0.2–0.9)
NEUTROS PCT: 53.7 %
Neutrophil #: 5 x10 3/mm (ref 1.4–6.5)
Platelet: 72 x10 3/mm — ABNORMAL LOW (ref 150–440)
RBC: 4.5 10*6/uL (ref 3.80–5.20)
RDW: 13.7 % (ref 11.5–14.5)
WBC: 9.2 x10 3/mm (ref 3.6–11.0)

## 2013-07-09 ENCOUNTER — Ambulatory Visit: Payer: Self-pay | Admitting: Internal Medicine

## 2013-09-29 ENCOUNTER — Ambulatory Visit: Payer: Self-pay | Admitting: Internal Medicine

## 2013-09-29 LAB — CBC CANCER CENTER
BASOS ABS: 0.1 x10 3/mm (ref 0.0–0.1)
BASOS PCT: 1.1 %
Eosinophil #: 0.2 x10 3/mm (ref 0.0–0.7)
Eosinophil %: 2.1 %
HCT: 41 % (ref 35.0–47.0)
HGB: 13.4 g/dL (ref 12.0–16.0)
LYMPHS ABS: 3.6 x10 3/mm (ref 1.0–3.6)
LYMPHS PCT: 38.5 %
MCH: 29.5 pg (ref 26.0–34.0)
MCHC: 32.8 g/dL (ref 32.0–36.0)
MCV: 90 fL (ref 80–100)
Monocyte #: 0.5 x10 3/mm (ref 0.2–0.9)
Monocyte %: 5.1 %
NEUTROS PCT: 53.2 %
Neutrophil #: 4.9 x10 3/mm (ref 1.4–6.5)
Platelet: 91 x10 3/mm — ABNORMAL LOW (ref 150–440)
RBC: 4.55 10*6/uL (ref 3.80–5.20)
RDW: 14.1 % (ref 11.5–14.5)
WBC: 9.3 x10 3/mm (ref 3.6–11.0)

## 2013-10-08 LAB — BASIC METABOLIC PANEL
BUN: 14 mg/dL (ref 4–21)
Creatinine: 0.9 mg/dL (ref 0.5–1.1)
GLUCOSE: 120 mg/dL
POTASSIUM: 4.2 mmol/L (ref 3.4–5.3)
SODIUM: 138 mmol/L (ref 137–147)

## 2013-10-08 LAB — LIPID PANEL
Cholesterol: 187 mg/dL (ref 0–200)
HDL: 49 mg/dL (ref 35–70)
LDL CALC: 115 mg/dL
Triglycerides: 116 mg/dL (ref 40–160)

## 2013-10-08 LAB — CBC AND DIFFERENTIAL
HCT: 40 % (ref 36–46)
HEMOGLOBIN: 13.7 g/dL (ref 12.0–16.0)
Platelets: 80 10*3/uL — AB (ref 150–399)
WBC: 7.4 10*3/mL

## 2013-10-08 LAB — TSH: TSH: 2.35 u[IU]/mL (ref 0.41–5.90)

## 2013-10-08 LAB — HEPATIC FUNCTION PANEL
ALT: 21 U/L (ref 7–35)
AST: 23 U/L (ref 13–35)

## 2013-10-08 LAB — HEMOGLOBIN A1C: Hgb A1c MFr Bld: 5.6 % (ref 4.0–6.0)

## 2013-10-09 ENCOUNTER — Ambulatory Visit: Payer: Self-pay | Admitting: Internal Medicine

## 2013-10-21 LAB — HM MAMMOGRAPHY

## 2013-12-22 ENCOUNTER — Ambulatory Visit: Payer: Self-pay | Admitting: Internal Medicine

## 2013-12-22 LAB — CBC CANCER CENTER
BASOS ABS: 0.1 x10 3/mm (ref 0.0–0.1)
Basophil %: 0.9 %
EOS ABS: 0.2 x10 3/mm (ref 0.0–0.7)
Eosinophil %: 1.7 %
HCT: 42.8 % (ref 35.0–47.0)
HGB: 14.1 g/dL (ref 12.0–16.0)
LYMPHS ABS: 3.8 x10 3/mm — AB (ref 1.0–3.6)
LYMPHS PCT: 40 %
MCH: 29.7 pg (ref 26.0–34.0)
MCHC: 32.8 g/dL (ref 32.0–36.0)
MCV: 90 fL (ref 80–100)
Monocyte #: 0.7 x10 3/mm (ref 0.2–0.9)
Monocyte %: 6.8 %
Neutrophil #: 4.9 x10 3/mm (ref 1.4–6.5)
Neutrophil %: 50.6 %
Platelet: 86 x10 3/mm — ABNORMAL LOW (ref 150–440)
RBC: 4.73 10*6/uL (ref 3.80–5.20)
RDW: 14 % (ref 11.5–14.5)
WBC: 9.6 x10 3/mm (ref 3.6–11.0)

## 2014-01-09 ENCOUNTER — Ambulatory Visit: Payer: Self-pay | Admitting: Internal Medicine

## 2014-01-11 ENCOUNTER — Ambulatory Visit: Payer: Self-pay | Admitting: Gastroenterology

## 2014-01-11 LAB — HM COLONOSCOPY

## 2014-03-16 ENCOUNTER — Ambulatory Visit: Payer: Self-pay | Admitting: Internal Medicine

## 2014-03-16 LAB — CBC CANCER CENTER
Basophil #: 0.1 x10 3/mm (ref 0.0–0.1)
Basophil %: 1 %
EOS PCT: 1.9 %
Eosinophil #: 0.2 x10 3/mm (ref 0.0–0.7)
HCT: 41 % (ref 35.0–47.0)
HGB: 13.7 g/dL (ref 12.0–16.0)
LYMPHS ABS: 3.6 x10 3/mm (ref 1.0–3.6)
Lymphocyte %: 38.2 %
MCH: 29.6 pg (ref 26.0–34.0)
MCHC: 33.4 g/dL (ref 32.0–36.0)
MCV: 89 fL (ref 80–100)
MONOS PCT: 6.6 %
Monocyte #: 0.6 x10 3/mm (ref 0.2–0.9)
Neutrophil #: 4.9 x10 3/mm (ref 1.4–6.5)
Neutrophil %: 52.3 %
PLATELETS: 69 x10 3/mm — AB (ref 150–440)
RBC: 4.63 10*6/uL (ref 3.80–5.20)
RDW: 13.8 % (ref 11.5–14.5)
WBC: 9.4 x10 3/mm (ref 3.6–11.0)

## 2014-03-16 LAB — CREATININE, SERUM
Creatine, Serum: 0.93
Creatinine: 0.93 mg/dL (ref 0.60–1.30)
EGFR (African American): >60
EGFR (Non-African Amer.): >60

## 2014-04-11 ENCOUNTER — Ambulatory Visit: Payer: Self-pay | Admitting: Internal Medicine

## 2014-07-02 NOTE — Op Note (Signed)
PATIENT NAME:  Julie Hinton, Julie Hinton MR#:  [id number] DATE OF BIRTH:  [birth year]  DATE OF PROCEDURE:  04/13/2013  PREOPERATIVE DIAGNOSES: Fibroid uterus, heavy bleeding, unresponsive to medical management with idiopathic thrombocytopenic purpura.    POSTOPERATIVE DIAGNOSES:  Fibroid uterus, heavy bleeding, unresponsive to medical management with idiopathic thrombocytopenic purpura.    PROCEDURE PERFORMED: Cystoscopy, laparoscopic supracervical hysterectomy, bilateral salpingectomy and minilaparotomy with placement of ON-Q pain pump.  Left ovarian incision and drainage of cyst.  SURGEON: Elliot Gurney, M.D.   ASSISTANT:  Odis Luster.   ESTIMATED BLOOD LOSS: 200 mL.  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  Anterior cyst on the  left ovary.   DESCRIPTION OF PROCEDURE:  The patient was taken to the operating room and placed in the supine position. After adequate general endotracheal anesthesia was instilled, the patient was prepped and draped in the usual sterile fashion. A side-opening speculum was placed in the patient's vagina. The anterior lip of the cervix was grasped with a single-tooth tenaculum. The uterus was sounded and a Hulka tenaculum was placed.  The side-opening speculum was removed. Attention was placed at the umbilicus, which was injected with Marcaine. An incision was made infraumbilically and long trocars were used throughout this whole procedure. Veress needle was placed. Hang drop test, fluid instillation test and fluid aspiration test showed proper placement of the Veress needle. CO2 was placed on low flow. When tympany was heard around the liver, CO2 was placed on high flow. The Veress needle was removed and the blunt trocar was placed under direct visualization into the abdomen through the umbilicus going straight down through the fat and tissue.  The patient was then  placed in Trendelenburg. Camera was placed. The aforementioned findings were seen. The patient had some fatty liver. She also had large amounts of fat on her tubes, her uterus and her anterior abdominal wall. The abdomen was then transilluminated and 2 trocars were placed; one on the right and one on the left. The Harmonic scalpel was placed. Her tube was removed; first on the right, then the left. Paratubal cysts were also identified. These were removed with the tubes. Serial bites were taken down the sides of the uterus. The utero-ovarian ligament was clamped and cut with the Harmonic scalpel; first on the right, then the left. Serial bites were then taken down the uterus. Bladder flap was created. Uterine arteries were skeletonized and identified. These were then cauterized with the Kleppinger, and the Harmonic scalpel was used to cut across the cervix. The trocar was then taken out of the umbilicus and after the belly was copiously irrigated with warm normal saline, as well as placing the Interceed across the cervix.   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 This was carried very sharply down to the fascia. The fascia was nicked in the midline. Muscle bellies were identified. The rectus fascia was removed anteriorly. The muscle bellies were split in the midline. The peritoneum was grasped. Surgeon's hand was placed into the pelvis. The uterus was grasped and removed. The muscle bellies were then approximated with a running Vicryl suture. The ON-Q pain pump was placed. The fascia was closed with a running Vicryl suture. An XLH was then used to approximate the subcutaneous fat. A 4-0 Monocryl was used to close the skin edges. Dermabond, benzoin and Steri-Strips were  placed. The ON-Q catheters were then wrapped around a 4 x 4 and placed onto the abdomen. Tegaderm was placed over this. The Hulka  tenaculum had been removed from the patient's vagina. Cystoscopy was then performed to ensure patency of the ureters, even though the ureters had been identified at the time of surgery and tracked up the pelvic brim down to the bladder.  Efflux was seen bilaterally, and the scope was removed. The patient was placed supine. Catheter was replaced into the patients bladder to drain.   The patient was then taken to recovery after having tolerated the procedure well.     ____________________________ Haydon Dorris C. Bettejane Leavens, MElliot Gurney cck:dmm D: 04/13/2013 08:33:57 ET T: 04/13/2013 10:33:40 ET JOB#: [id number]  cc: Elliot Gurneyarrie C. Astella Desir, MD, <Dictator>

## 2014-07-02 NOTE — Op Note (Signed)
PATIENT NAME:  Julie Hinton, Julie Hinton MR#:  [id number] DATE OF BIRTH:  [birth year]  DATE OF PROCEDURE:  04/01/2013  PREOPERATIVE DIAGNOSES: Fibroid uterus, heavy bleeding, unresponsive to medical management with idiopathic thrombocytopenic purpura.    POSTOPERATIVE DIAGNOSES:  Fibroid uterus, heavy bleeding, unresponsive to medical management with idiopathic thrombocytopenic purpura.    PROCEDURE PERFORMED: Cystoscopy, laparoscopic supracervical hysterectomy, bilateral salpingectomy and minilaparotomy with placement of ON-Q pain pump.  Left ovarian incision and drainage of cyst.  SURGEON: Elliot Gurney, M.D.   ASSISTANT:  Odis Luster.   ESTIMATED BLOOD LOSS: 200 mL.  FINDINGS:  Approximately 12 week size uterus with multiple fibroids, poly on the anterior with normal tubes bilaterally, normal ovaries.  Anterior cyst on the  left ovary.   DESCRIPTION OF PROCEDURE:  The patient was taken to the operating room and placed in the supine position. After adequate general endotracheal anesthesia was instilled, the patient was prepped and draped in the usual sterile fashion. A side-opening speculum was placed in the patient's vagina. The anterior lip of the cervix was grasped with a single-tooth tenaculum. The uterus was sounded and a Hulka tenaculum was placed.  The side-opening speculum was removed. Attention was placed at the umbilicus, which was injected with Marcaine. An incision was made infraumbilically and long trocars were used throughout this whole procedure. Veress needle was placed. Hang drop test, fluid instillation test and fluid aspiration test showed proper placement of the Veress needle. CO2 was placed on low flow. When tympany was heard around the liver, CO2 was placed on high flow. The Veress needle was removed and the blunt trocar was placed under direct visualization into the abdomen through the umbilicus going straight down through the fat and tissue.  The patient was then placed in  Trendelenburg. Camera was placed. The aforementioned findings were seen. The patient had some fatty liver. She also had large amounts of fat on her tubes, her uterus and her anterior abdominal wall. The abdomen was then transilluminated and 2 trocars were placed; one on the right and one on the left. The Harmonic scalpel was placed. Her tube was removed; first on the right, then the left. Paratubal cysts were also identified. These were removed with the tubes. Serial bites were taken down the sides of the uterus. The utero-ovarian ligament was clamped and cut with the Harmonic scalpel; first on the right, then the left. Serial bites were then taken down the uterus. Bladder flap was created. Uterine arteries were skeletonized and identified. These were then cauterized with the Kleppinger, and the Harmonic scalpel was used to cut across the cervix. The trocar was then taken out of the umbilicus and after the belly was copiously irrigated with warm normal saline, as well as placing the Interceed across the cervix, the pubic symphysis was identified and a 3 inch Pfannenstiel skin incision was made approximately 3 fingerbreadths above the pubic symphysis. This was carried very sharply down to the fascia. The fascia was nicked in the midline. Muscle bellies were identified. The rectus fascia was removed anteriorly. The muscle bellies were split in the midline. The peritoneum was grasped. Surgeon's hand was placed into the pelvis. The uterus was grasped and removed. The muscle bellies were then approximated with a running Vicryl suture. The ON-Q pain pump was placed. The fascia was closed with a running Vicryl suture. An XLH was then used to approximate the subcutaneous fat. A 4-0 Monocryl was used to close the skin edges. Dermabond, benzoin and Steri-Strips were placed. The  ON-Q catheters were then wrapped around a 4 x 4 and placed onto the abdomen. Tegaderm was placed over this. The Hulka tenaculum had been removed from  the patient's vagina. Cystoscopy was then performed to ensure patency of the ureters, even though the ureters had been identified at the time of surgery and tracked up the pelvic brim down to the bladder.  Efflux was seen bilaterally, and the scope was removed. The patient was placed supine. Catheter was replaced into the patient's bladder to drain.   The patient was then taken to recovery after having tolerated the procedure well.     ____________________________ Elliot Gurneyarrie C. Maralyn Witherell, MD cck:dmm D: 04/13/2013 08:33:00 ET T: 04/13/2013 10:33:40 ET JOB#: [id number]  cc: Elliot Gurneyarrie C. Daysi Boggan, MD, <Dictator> Elliot GurneyARRIE C Gaspard Isbell MD ELECTRONICALLY SIGNED 04/15/2013 21:41

## 2014-07-13 ENCOUNTER — Other Ambulatory Visit: Payer: Self-pay

## 2014-07-15 DIAGNOSIS — K219 Gastro-esophageal reflux disease without esophagitis: Secondary | ICD-10-CM | POA: Insufficient documentation

## 2014-07-15 DIAGNOSIS — D696 Thrombocytopenia, unspecified: Secondary | ICD-10-CM | POA: Insufficient documentation

## 2014-07-15 DIAGNOSIS — R739 Hyperglycemia, unspecified: Secondary | ICD-10-CM | POA: Insufficient documentation

## 2014-10-07 ENCOUNTER — Ambulatory Visit (INDEPENDENT_AMBULATORY_CARE_PROVIDER_SITE_OTHER): Payer: BLUE CROSS/BLUE SHIELD | Admitting: Family Medicine

## 2014-10-07 ENCOUNTER — Encounter: Payer: Self-pay | Admitting: Family Medicine

## 2014-10-07 VITALS — BP 118/60 | HR 62 | Temp 97.4°F | Resp 16 | Ht 64.0 in | Wt 314.0 lb

## 2014-10-07 DIAGNOSIS — E7800 Pure hypercholesterolemia, unspecified: Secondary | ICD-10-CM

## 2014-10-07 DIAGNOSIS — Z0000 Encounter for general adult medical examination without abnormal findings: Secondary | ICD-10-CM

## 2014-10-07 DIAGNOSIS — I10 Essential (primary) hypertension: Secondary | ICD-10-CM

## 2014-10-07 LAB — POCT URINALYSIS DIPSTICK
Bilirubin, UA: NEGATIVE
Blood, UA: NEGATIVE
GLUCOSE UA: NEGATIVE
Ketones, UA: NEGATIVE
LEUKOCYTES UA: NEGATIVE
NITRITE UA: NEGATIVE
Protein, UA: NEGATIVE
SPEC GRAV UA: 1.025
Urobilinogen, UA: 0.2
pH, UA: 6

## 2014-10-07 MED ORDER — ATENOLOL 25 MG PO TABS: 25.0000 mg | ORAL_TABLET | Freq: Every day | ORAL | Status: DC

## 2014-10-07 NOTE — Patient Instructions (Signed)
Knee Exercises EXERCISES RANGE OF MOTION (ROM) AND STRETCHING EXERCISES These exercises may help you when beginning to rehabilitate your injury. Your symptoms may resolve with or without further involvement from your physician, physical therapist, or athletic trainer. While completing these exercises, remember:   Restoring tissue flexibility helps normal motion to return to the joints. This allows healthier, less painful movement and activity.  An effective stretch should be held for at least 30 seconds.  A stretch should never be painful. You should only feel a gentle lengthening or release in the stretched tissue. STRETCH - Knee Extension, Prone  Lie on your stomach on a firm surface, such as a bed or countertop. Place your right / left knee and leg just beyond the edge of the surface. You may wish to place a towel under the far end of your right / left thigh for comfort.  Relax your leg muscles and allow gravity to straighten your knee. Your clinician may advise you to add an ankle weight if more resistance is helpful for you.  You should feel a stretch in the back of your right / left knee. Hold this position for __________ seconds. Repeat __________ times. Complete this stretch __________ times per day. * Your physician, physical therapist, or athletic trainer may ask you to add ankle weight to enhance your stretch.  RANGE OF MOTION - Knee Flexion, Active  Lie on your back with both knees straight. (If this causes back discomfort, bend your opposite knee, placing your foot flat on the floor.)  Slowly slide your heel back toward your buttocks until you feel a gentle stretch in the front of your knee or thigh.  Hold for __________ seconds. Slowly slide your heel back to the starting position. Repeat __________ times. Complete this exercise __________ times per day.  STRETCH - Quadriceps, Prone   Lie on your stomach on a firm surface, such as a bed or padded floor.  Bend your right /  left knee and grasp your ankle. If you are unable to reach your ankle or pant leg, use a belt around your foot to lengthen your reach.  Gently pull your heel toward your buttocks. Your knee should not slide out to the side. You should feel a stretch in the front of your thigh and/or knee.  Hold this position for __________ seconds. Repeat __________ times. Complete this stretch __________ times per day.  STRETCH - Hamstrings, Supine   Lie on your back. Loop a belt or towel over the ball of your right / left foot.  Straighten your right / left knee and slowly pull on the belt to raise your leg. Do not allow the right / left knee to bend. Keep your opposite leg flat on the floor.  Raise the leg until you feel a gentle stretch behind your right / left knee or thigh. Hold this position for __________ seconds. Repeat __________ times. Complete this stretch __________ times per day.  STRENGTHENING EXERCISES These exercises may help you when beginning to rehabilitate your injury. They may resolve your symptoms with or without further involvement from your physician, physical therapist, or athletic trainer. While completing these exercises, remember:   Muscles can gain both the endurance and the strength needed for everyday activities through controlled exercises.  Complete these exercises as instructed by your physician, physical therapist, or athletic trainer. Progress the resistance and repetitions only as guided.  You may experience muscle soreness or fatigue, but the pain or discomfort you are trying to eliminate   should never worsen during these exercises. If this pain does worsen, stop and make certain you are following the directions exactly. If the pain is still present after adjustments, discontinue the exercise until you can discuss the trouble with your clinician. STRENGTH - Quadriceps, Isometrics  Lie on your back with your right / left leg extended and your opposite knee  bent.  Gradually tense the muscles in the front of your right / left thigh. You should see either your knee cap slide up toward your hip or increased dimpling just above the knee. This motion will push the back of the knee down toward the floor/mat/bed on which you are lying.  Hold the muscle as tight as you can without increasing your pain for __________ seconds.  Relax the muscles slowly and completely in between each repetition. Repeat __________ times. Complete this exercise __________ times per day.  STRENGTH - Quadriceps, Short Arcs   Lie on your back. Place a __________ inch towel roll under your knee so that the knee slightly bends.  Raise only your lower leg by tightening the muscles in the front of your thigh. Do not allow your thigh to rise.  Hold this position for __________ seconds. Repeat __________ times. Complete this exercise __________ times per day.  OPTIONAL ANKLE WEIGHTS: Begin with ____________________, but DO NOT exceed ____________________. Increase in 1 pound/0.5 kilogram increments.  STRENGTH - Quadriceps, Straight Leg Raises  Quality counts! Watch for signs that the quadriceps muscle is working to insure you are strengthening the correct muscles and not "cheating" by substituting with healthier muscles.  Lay on your back with your right / left leg extended and your opposite knee bent.  Tense the muscles in the front of your right / left thigh. You should see either your knee cap slide up or increased dimpling just above the knee. Your thigh may even quiver.  Tighten these muscles even more and raise your leg 4 to 6 inches off the floor. Hold for __________ seconds.  Keeping these muscles tense, lower your leg.  Relax the muscles slowly and completely in between each repetition. Repeat __________ times. Complete this exercise __________ times per day.  STRENGTH - Hamstring, Curls  Lay on your stomach with your legs extended. (If you lay on a bed, your feet  may hang over the edge.)  Tighten the muscles in the back of your thigh to bend your right / left knee up to 90 degrees. Keep your hips flat on the bed/floor.  Hold this position for __________ seconds.  Slowly lower your leg back to the starting position. Repeat __________ times. Complete this exercise __________ times per day.  OPTIONAL ANKLE WEIGHTS: Begin with ____________________, but DO NOT exceed ____________________. Increase in 1 pound/0.5 kilogram increments.  STRENGTH - Quadriceps, Squats  Stand in a door frame so that your feet and knees are in line with the frame.  Use your hands for balance, not support, on the frame.  Slowly lower your weight, bending at the hips and knees. Keep your lower legs upright so that they are parallel with the door frame. Squat only within the range that does not increase your knee pain. Never let your hips drop below your knees.  Slowly return upright, pushing with your legs, not pulling with your hands. Repeat __________ times. Complete this exercise __________ times per day.  STRENGTH - Quadriceps, Wall Slides  Follow guidelines for form closely. Increased knee pain often results from poorly placed feet or knees.  Lean   against a smooth wall or door and walk your feet out 18-24 inches. Place your feet hip-width apart.  Slowly slide down the wall or door until your knees bend __________ degrees.* Keep your knees over your heels, not your toes, and in line with your hips, not falling to either side.  Hold for __________ seconds. Stand up to rest for __________ seconds in between each repetition. Repeat __________ times. Complete this exercise __________ times per day. * Your physician, physical therapist, or athletic trainer will alter this angle based on your symptoms and progress. Document Released: 01/09/2005 Document Revised: 07/12/2013 Document Reviewed: 06/09/2008 [employer] Patient Information 2015 [employer]. This information is not  intended to replace advice given to you by your health care provider. Make sure you discuss any questions you have with your health care provider.  

## 2014-10-07 NOTE — Progress Notes (Signed)
Patient ID: Julie Hinton, female   DOB: 09-18-1960, 54 y.o.   MRN: [id number]       Patient: Julie Hinton, Female    DOB: Mar 01, 1961, 54 y.o.   MRN: [id number] Visit Date: 10/07/2014  Today's Provider: Lorie Phenix, MD   Chief Complaint  Patient presents with  . Annual Exam   Subjective:    Annual physical exam Julie Hinton is a 54 y.o. female who presents today for health maintenance and complete physical. She feels well. She reports she is not exercising. She reports she is sleeping well. Pap-- 09/24/12- Neg with Neg HPV- pt had hysterectomy, shortly after this. mammogram- 10/21/13 Tdap- 09/24/12 Colonoscopy- 01/11/14  -----------------------------------------------------------------  Hypertension, follow-up:  BP Readings from Last 3 Encounters:  10/07/14 118/60  10/06/13 118/72  03/16/14 115/70    She reports good compliance with treatment. She is not having side effects.  She is not exercising. Outside blood pressures are not being checked. She is experiencing none.  Cardiovascular risk factors include obesity (BMI >= 30 kg/m2).     Weight trend: stable Wt Readings from Last 3 Encounters:  10/07/14 314 lb (142.429 kg)  10/06/13 319 lb (144.697 kg)  03/16/14 317 lb 7.4 oz (144 kg)   Pt reports that she will need more refills on her Atenolol, she only has a couple refills left and she wants to go ahead and get refills while she is here.  ------------------------------------------------------------------------      Review of Systems  Constitutional: Negative.   HENT: Negative.   Eyes: Negative.   Respiratory: Negative.   Cardiovascular: Negative.   Gastrointestinal: Negative.   Endocrine: Negative.   Genitourinary: Negative.   Musculoskeletal: Positive for arthralgias.  Allergic/Immunologic: Negative.   Neurological: Negative.   Hematological: Negative.   Psychiatric/Behavioral: Negative.     Social History She  reports that she has never smoked.  She does not have any smokeless tobacco history on file. She reports that she does not drink alcohol or use illicit drugs.  Patient Active Problem List   Diagnosis Date Noted  . Acid reflux 07/15/2014  . Blood glucose elevated 07/15/2014  . 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 Avitaminosis D 08/16/2009  . Asthma without status asthmaticus 07/17/2009  . Allergic rhinitis 06/09/2009  . Hypercholesteremia 06/19/2005  . BP (high blood pressure) 02/03/2001  . Body mass index of 60 or higher 03/11/1998  . Extreme obesity 03/11/1998    Past Surgical History  Procedure Laterality Date  . Abdominal hysterectomy  04/01/2013    still has ovaries  . Cesarean section  1999    Family History Her family history includes Arthritis in her maternal aunt; Breast cancer in her maternal aunt; CAD in her father; Hypertension in her mother; Lung cancer in her paternal grandfather; Stroke in her maternal grandfather and mother.    Allergies  Allergen Reactions  . Cephalexin     Other reaction(s): Respiratory distress (finding)  . Aspirin Rash and Hives    Previous Medications   ALBUTEROL (PROVENTIL HFA;VENTOLIN HFA) 108 (90 BASE) MCG/ACT INHALER    Inhale 2 puffs into the lungs every 6 (six) hours as needed for wheezing or shortness of breath.   ATENOLOL (TENORMIN) 25 MG TABLET    Take by mouth daily.   CHOLECALCIFEROL (VITAMIN D-3) 1000 UNITS CAPS    Take by mouth daily.   FLUTICASONE (FLOVENT DISKUS) 50 MCG/BLIST DISKUS INHALER    Inhale 1 puff into the lungs 2 (two) times daily.   VITAMIN  B-12 (CYANOCOBALAMIN) 500 MCG TABLET    Take 500 mcg by mouth daily.    Patient Care Team: Lorie Phenix, MD as PCP - General (Family Medicine)     Objective:   Vitals: BP 118/60 mmHg  Pulse 62  Temp(Src) 97.4 F (36.3 C) (Oral)  Resp 16  Ht 5\' 4"  (1.626 m)  Wt 314 lb (142.429 kg)  BMI 53.87 kg/m2   Physical Exam  Constitutional: She is oriented to person, place, and time. She  appears well-developed and well-nourished.  HENT:  Head: Normocephalic and atraumatic.  Right Ear: External ear normal.  Left Ear: External ear normal.  Nose: Nose normal.  Mouth/Throat: Oropharynx is clear and moist.  Eyes: Conjunctivae and EOM are normal. Pupils are equal, round, and reactive to light.  Neck: Normal range of motion. Neck supple.  Cardiovascular: Normal rate, regular rhythm, normal heart sounds and intact distal pulses.   Pulmonary/Chest: Effort normal and breath sounds normal.  Abdominal: Bowel sounds are normal.  Genitourinary:  Did not examine due to pap in 2014 and hysterectomy shortly after that.  Musculoskeletal: Normal range of motion.  Neurological: She is alert and oriented to person, place, and time. She has normal reflexes.  Skin: Skin is warm and dry.  Psychiatric: She has a normal mood and affect. Her behavior is normal. Judgment and thought content normal.      Assessment & Plan:     Routine Health Maintenance and Physical Exam  Exercise Activities and Dietary recommendations Goals    None      Immunization History  Administered Date(s) Administered  . Td 06/15/2004  . Tdap 09/24/2012    Health Maintenance  Topic Date Due  . HIV Screening  06/17/1977  . INFLUENZA VACCINE  10/10/2014  . PAP SMEAR  09/25/2015  . MAMMOGRAM  10/22/2015  . TETANUS/TDAP  09/25/2022  . COLONOSCOPY  01/12/2024   Results for orders placed or performed in visit on 10/07/14  POCT urinalysis dipstick  Result Value Ref Range   Color, UA amber    Clarity, UA clear    Glucose, UA neg    Bilirubin, UA neg    Ketones, UA neg    Spec Grav, UA 1.025    Blood, UA neg    pH, UA 6.0    Protein, UA neg    Urobilinogen, UA 0.2    Nitrite, UA neg    Leukocytes, UA Negative Negative       Discussed health benefits of physical activity, and encouraged her to engage in regular exercise appropriate for her age and condition.     2. Essential  hypertension Condition is stable. Please continue current medication and  plan of care as noted.  Will check labs.  - POCT urinalysis dipstick - CBC with Differential/Platelet - TSH - Comprehensive metabolic panel  3. Hypercholesteremia Not currently on medication. Check labs.  - Lipid Panel With LDL/HDL Ratio  --------------------------------------------------------------------  Patient was seen and examined by Leo Grosser, MD, and note scribed by Dimas Chyle, CMA. I have reviewed the document for accuracy and completeness and I agree with above. Leo Grosser, MD   Lorie Phenix, MD

## 2014-10-08 LAB — CBC WITH DIFFERENTIAL/PLATELET
BASOS ABS: 0 10*3/uL (ref 0.0–0.2)
Basos: 1 %
EOS (ABSOLUTE): 0.2 10*3/uL (ref 0.0–0.4)
Eos: 3 %
HEMATOCRIT: 42.5 % (ref 34.0–46.6)
Hemoglobin: 14.3 g/dL (ref 11.1–15.9)
Immature Grans (Abs): 0 10*3/uL (ref 0.0–0.1)
Immature Granulocytes: 0 %
LYMPHS ABS: 3.3 10*3/uL — AB (ref 0.7–3.1)
LYMPHS: 42 %
MCH: 29.5 pg (ref 26.6–33.0)
MCHC: 33.6 g/dL (ref 31.5–35.7)
MCV: 88 fL (ref 79–97)
Monocytes Absolute: 0.5 10*3/uL (ref 0.1–0.9)
Monocytes: 7 %
NEUTROS ABS: 3.8 10*3/uL (ref 1.4–7.0)
Neutrophils: 47 %
Platelets: 99 10*3/uL — CL (ref 150–379)
RBC: 4.85 x10E6/uL (ref 3.77–5.28)
RDW: 14.3 % (ref 12.3–15.4)
WBC: 7.9 10*3/uL (ref 3.4–10.8)

## 2014-10-08 LAB — COMPREHENSIVE METABOLIC PANEL
A/G RATIO: 1.4 (ref 1.1–2.5)
ALT: 20 IU/L (ref 0–32)
AST: 22 IU/L (ref 0–40)
Albumin: 4.3 g/dL (ref 3.5–5.5)
Alkaline Phosphatase: 78 IU/L (ref 39–117)
BILIRUBIN TOTAL: 0.8 mg/dL (ref 0.0–1.2)
BUN/Creatinine Ratio: 19 (ref 9–23)
BUN: 16 mg/dL (ref 6–24)
CO2: 23 mmol/L (ref 18–29)
Calcium: 9.3 mg/dL (ref 8.7–10.2)
Chloride: 96 mmol/L — ABNORMAL LOW (ref 97–108)
Creatinine, Ser: 0.84 mg/dL (ref 0.57–1.00)
GFR calc Af Amer: 92 mL/min/{1.73_m2} (ref >59)
GFR, EST NON AFRICAN AMERICAN: 80 mL/min/{1.73_m2} (ref >59)
GLUCOSE: 110 mg/dL — AB (ref 65–99)
Globulin, Total: 3.1 g/dL (ref 1.5–4.5)
Potassium: 4.5 mmol/L (ref 3.5–5.2)
Sodium: 139 mmol/L (ref 134–144)
Total Protein: 7.4 g/dL (ref 6.0–8.5)

## 2014-10-08 LAB — LIPID PANEL WITH LDL/HDL RATIO
Cholesterol, Total: 205 mg/dL — ABNORMAL HIGH (ref 100–199)
HDL: 44 mg/dL (ref >39)
LDL Calculated: 133 mg/dL — ABNORMAL HIGH (ref 0–99)
LDL/HDL RATIO: 3 ratio (ref 0.0–3.2)
Triglycerides: 139 mg/dL (ref 0–149)
VLDL Cholesterol Cal: 28 mg/dL (ref 5–40)

## 2014-10-08 LAB — TSH: TSH: 3.04 u[IU]/mL (ref 0.450–4.500)

## 2014-10-10 ENCOUNTER — Telehealth: Payer: Self-pay

## 2014-10-10 NOTE — Telephone Encounter (Signed)
Pt is returning call.  CB#[phone]/MW

## 2014-10-10 NOTE — Telephone Encounter (Signed)
Pt advised.   Thanks,   -Craig Wisnewski  

## 2014-10-10 NOTE — Telephone Encounter (Signed)
Unable to leave message,l will try again later.

## 2014-10-10 NOTE — Telephone Encounter (Signed)
-----   Message from Lorie Phenix, MD sent at 10/08/2014 12:31 PM EDT ----- Labs stable. Platelets still low. Please notify patient. Thanks.

## 2014-11-16 ENCOUNTER — Inpatient Hospital Stay: Payer: BLUE CROSS/BLUE SHIELD | Attending: Internal Medicine

## 2015-03-15 ENCOUNTER — Inpatient Hospital Stay: Payer: BLUE CROSS/BLUE SHIELD | Attending: Internal Medicine

## 2015-07-11 ENCOUNTER — Encounter: Payer: Self-pay | Admitting: *Deleted

## 2015-07-11 ENCOUNTER — Other Ambulatory Visit: Payer: Self-pay | Admitting: *Deleted

## 2015-07-11 DIAGNOSIS — D693 Immune thrombocytopenic purpura: Secondary | ICD-10-CM

## 2015-07-12 ENCOUNTER — Encounter: Payer: Self-pay | Admitting: *Deleted

## 2015-07-12 ENCOUNTER — Ambulatory Visit: Payer: Self-pay | Admitting: Internal Medicine

## 2015-07-12 ENCOUNTER — Inpatient Hospital Stay: Payer: BLUE CROSS/BLUE SHIELD

## 2015-07-12 ENCOUNTER — Inpatient Hospital Stay: Payer: BLUE CROSS/BLUE SHIELD | Admitting: Internal Medicine

## 2015-07-26 ENCOUNTER — Inpatient Hospital Stay: Payer: BLUE CROSS/BLUE SHIELD | Admitting: Internal Medicine

## 2015-07-26 ENCOUNTER — Inpatient Hospital Stay: Payer: BLUE CROSS/BLUE SHIELD

## 2015-08-02 ENCOUNTER — Inpatient Hospital Stay: Payer: BLUE CROSS/BLUE SHIELD

## 2015-08-02 ENCOUNTER — Inpatient Hospital Stay: Payer: BLUE CROSS/BLUE SHIELD | Admitting: Internal Medicine

## 2015-08-03 ENCOUNTER — Encounter: Payer: Self-pay | Admitting: *Deleted

## 2015-08-03 NOTE — Progress Notes (Signed)
Patient no show/discharge letter via certified mail returned back to provider by [employer]. Will send letter via [employer] routine mail.

## 2015-08-21 ENCOUNTER — Inpatient Hospital Stay: Payer: BLUE CROSS/BLUE SHIELD

## 2015-08-21 ENCOUNTER — Inpatient Hospital Stay: Payer: BLUE CROSS/BLUE SHIELD | Admitting: Oncology

## 2015-09-04 ENCOUNTER — Inpatient Hospital Stay: Payer: BLUE CROSS/BLUE SHIELD | Admitting: Oncology

## 2015-09-04 ENCOUNTER — Inpatient Hospital Stay: Payer: BLUE CROSS/BLUE SHIELD

## 2015-09-05 ENCOUNTER — Encounter: Payer: Self-pay | Admitting: *Deleted

## 2015-12-25 ENCOUNTER — Other Ambulatory Visit: Payer: Self-pay | Admitting: Physician Assistant

## 2015-12-25 DIAGNOSIS — I10 Essential (primary) hypertension: Secondary | ICD-10-CM

## 2015-12-25 MED ORDER — ATENOLOL 25 MG PO TABS: 25.0000 mg | ORAL_TABLET | Freq: Every day | ORAL | 3 refills | Status: AC

## 2015-12-25 NOTE — Telephone Encounter (Signed)
Pt contacted office for refill request on the following medications: atenolol (TENORMIN) 25 MG tab This was a pt of Dr. Santiago BurMaloney's. Last OV: 10/07/14 Last Written: 10/07/14 I advised pt would probably need OV. Pt request that we give her a written Rx for the medication b/c social services would help her get it filled cheaper and she would rather not come in for OV at this time b/c she lost her job and doesn't have insurance at this time. Please advise. Thanks TNP

## 2015-12-25 NOTE — Telephone Encounter (Signed)
Also let patient know that atenolol is on national backorder so many pharmacies do not have this medication. May need to switch her. I will give refill for atenolol and if she is unable to get filled let me know so I can give her a medicine that is not on backorder.

## 2015-12-25 NOTE — Telephone Encounter (Signed)
Please Review.  Thanks,  -Joseline 

## 2015-12-25 NOTE — Telephone Encounter (Signed)
Patient advised as below.  

## 2016-12-06 ENCOUNTER — Ambulatory Visit: Payer: Medicaid Other | Admitting: Family Medicine

## 2016-12-10 ENCOUNTER — Telehealth: Payer: Self-pay | Admitting: Nurse Practitioner

## 2016-12-10 NOTE — Telephone Encounter (Signed)
Referred by [employer]. Has Medicaid but wants to be a new pt
